# Patient Record
Sex: Female | Born: 1965 | Race: White | Hispanic: No | Marital: Single | Smoking: Former smoker
Health system: Southern US, Community
[De-identification: ages and names within clinical notes are randomized; demographics above are authoritative.]

## PROBLEM LIST (undated history)

## (undated) HISTORY — PX: CHOLECYSTECTOMY: SHX55

---

## 2005-02-23 ENCOUNTER — Inpatient Hospital Stay (HOSPITAL_COMMUNITY): Admission: RE | Admit: 2005-02-23 | Discharge: 2005-02-26 | Payer: Self-pay | Admitting: Gastroenterology

## 2005-02-25 ENCOUNTER — Encounter (INDEPENDENT_AMBULATORY_CARE_PROVIDER_SITE_OTHER): Payer: Self-pay | Admitting: Specialist

## 2007-03-18 ENCOUNTER — Ambulatory Visit: Payer: Self-pay | Admitting: Surgery

## 2008-11-09 ENCOUNTER — Other Ambulatory Visit: Admission: RE | Admit: 2008-11-09 | Discharge: 2008-11-09 | Payer: Self-pay | Admitting: Family Medicine

## 2010-10-11 NOTE — Procedures (Signed)
DUPLEX DEEP VENOUS EXAM - LOWER EXTREMITY   INDICATION:  Right leg edema   HISTORY:  Edema:  Two days of right leg edema  Trauma/Surgery:  Patient recently wore a brace on her right leg  Pain:  Two days, right leg  PE:  No  Previous DVT:  Possible DVT 20 years ago  Anticoagulants:  No  Other:  No   DUPLEX EXAM:                CFV   SFV   PopV  PTV    GSV                R  L  R  L  R  L  R   L  R  L  Thrombosis    o  o  o     o     o      o  Spontaneous   +  +  +     +     +      +  Phasic        +  +  +     +     +      +  Augmentation  +  +  +     +     +      +  Compressible  +  +  +     +     +      +  Competent     +  +  +     +     +      +   Legend:  + - yes  o - no  p - partial  D - decreased   IMPRESSION:  No evidence of right leg DVT, SVT, or Baker cyst.   _____________________________  Janetta Hora. Fields, MD   MC/MEDQ  D:  03/18/2007  T:  03/19/2007  Job:  [id number]

## 2010-10-14 NOTE — Op Note (Signed)
[name], [name]           ACCOUNT NO.:  [id number]   MEDICAL RECORD NO.:  [id number]          PATIENT TYPE:  INP   LOCATION:  1307                         FACILITY:  [facility]   PHYSICIAN:  Anselm Pancoast. Weatherly, M.D.DATE OF BIRTH:  06-05-1965   DATE OF PROCEDURE:  02/25/2005  DATE OF DISCHARGE:                                 OPERATIVE REPORT   PREOPERATIVE DIAGNOSIS:  Chronic cholecystitis with stones.   POSTOPERATIVE DIAGNOSIS:  Chronic cholecystitis with stones.   OPERATION:  Laparoscopic cholecystectomy with cholangiogram.   ANESTHESIA:  General anesthesia.   SURGEON:  Dr. Consuello Bossier.   ASSISTANT:  Angelia Mould. Derrell Lolling, M.D.   HISTORY:  [name] is a 45 year old female who was admitted two  days ago by Dr. Matthias Hughs, had an ERCP yesterday after she had three episodes  of severe epigastric pain and had a bump in her liver enzymes. She had seen  Dr. Luisa Hart for symptomatic gallstones and he had scheduled for a  laparoscopic cholecystectomy, I think in the first week in November but with  these symptoms she first went to, I think it was the [facility]. They  noted the abnormality in her laboratory studies and Dr. Matthias Hughs who was on  call was asked to see her. He admitted the patient and the following  morning, had an ERCP by Dr. Madilyn Fireman.  The ERCP appeared to be normal.  A  balloon was used to kind of flip through the ampullary and no sphincterotomy  performed and postoperatively, her pain was basically significantly  subsided. She had had a mildly elevated amylase of 509 and a lipase of 156  plus SGOT and SGPT of 3.80 and 390, respectively. Today we are planning to  proceed with a laparoscopic cholecystectomy and she is in agreement with the  planned procedure. The patient was identified, taken to the operative suite.  She has 3 grams of Unasyn and she has PAS stockings. The patient's abdomen  was prepped after induction of anesthesia and endotracheal tube,  oral tube  to the stomach and draped in a sterile manner. A small incision was made  below the umbilicus. The fascia identified. She is little deeper that she  grossly appears.  The fascia picked up between two Kocher's and a small  opening carefully made into the peritoneal cavity. Pursestring suture of 0  Vicryl was placed and the Hasson cannula was introduced.  The camera was  inserted. The gallbladder did have significant adhesions around it but not  acutely inflamed and not distended. The upper 10 mL trocar was placed in the  subxiphoid area and anesthetized in the fascia and the two lateral 5 mm  trocars were placed at the appropriate lateral position by Dr. Derrell Lolling after  anesthetizing the fascia. The gallbladder was retracted upward and with the  adhesions from the gallbladder to the omentum and also the duodenum were  kind of carefully dissected. These dropped down and then the proximal  portion of the gallbladder was identified and we opened the peritoneum here,  sort of separated the cystic duct from the cystic artery and then clipped  the gallbladder  cystic duct junction and also placed two clips proximally on  the cystic artery and one distally but did not divide any of these  structures.  I then opened the cystic duct carefully just proximal to the  clip because I could not really see where the junction of the cystic duct,  common hepatic and common bile duct was. The catheter was a Cook catheter  and it was held in place with a clip and then the C-arm brought in and a  repeat cholangiogram performed.  It did show prompt filling of the  extrahepatic biliary system flowing into the duodenum and the little cystic  duct was probably about 1.5 cm in length. We then removed the clip, holding  the catheter and the bile flush back and there was two little cholesterol  stones probably about 1.5 mm in size that we could flushing back but these  had not been noted on the cholangiogram.  The cystic duct was then carefully  freed up circumferentially with the right angle so I could get two good  clips on the area of the lobe where we had made the little otomy and then  placed a third clip distal to these and then divided the cystic duct. The  cystic artery was next divided. The clips had previously been placed and  then the gallbladder was freed up from its bed predominantly with the hook  electrocautery. We then switched the camera to the upper 10 mL trocar site.  There was good hemostasis and the gallbladder containing was grasped and  brought out through the fascial defect. The stones were small and  reinspection of the gallbladder fossa revealed good hemostasis. I put an  additional figure-of-eight suture of 0 Vicryl in the fascia at the  umbilicus, tied both and anesthetized the fascia at the umbilicus and put  one figure-of-eight suture in the anterior fascia in the subxiphoid trocar  site. The subcutaneous wounds were closed with Vicryl and Benzoin and Steri-  Strips were placed on the skin. The patient tolerated the procedure nicely.  We will probably elect to stay today and go home in the morning and  hopefully will have no further episodes of pain. I had discussed with the  patient's mother that with normal cholangiogram and was normal ERCP but  typically with these little stones that flushed back removal of the catheter  that there may be other little stones and she may have another little  episode of pain but hopefully these should pass and she should be ready to  be discharged in the a.m.           ______________________________  Anselm Pancoast. Zachery Dakins, M.D.     WJW/MEDQ  D:  02/25/2005  T:  02/25/2005  Job:  [id number]   cc:   Everardo All. Madilyn Fireman, M.D.  Fax: [phone]

## 2010-10-14 NOTE — Op Note (Signed)
[name], [name]           ACCOUNT NO.:  [id number]   MEDICAL RECORD NO.:  [id number]          PATIENT TYPE:  INP   LOCATION:  1307                         FACILITY:  [facility]   PHYSICIAN:  John C. Madilyn Fireman, M.D.    DATE OF BIRTH:  [birth year]   DATE OF PROCEDURE:  02/24/2005  DATE OF DISCHARGE:                                 OPERATIVE REPORT   Endoscopic retrograde cholangiopancreatography with sphincterotomy and  balloon sweep.   INDICATIONS FOR PROCEDURE:  Known gallstones with abdominal pain and  elevated liver function tests, suspicious for bile duct stones.   PROCEDURE:  The patient was placed in the prone position and placed on the  pulse monitor with continuous low-flow oxygen delivered by nasal cannula.  She was sedated with 60 mcg IV fentanyl and 6 mg IV Versed. Olympus video  side-viewing endoscope was advanced blindly into the oropharynx, esophagus,  and stomach. Stomach appeared grossly unremarkable. The pylorus was  traversed and papilla of Vater located on the medial duodenal wall had  normal appearance. Initial cannulation with a sphincterotome entered the  pancreatic duct and pancreatic injection performed revealing a normal  pancreatic duct. With repositioning, the common bile duct was selectively  cannulated with a wire. A cholangiogram was obtained which showed a slightly  dilated common bile duct with the cystic duct and gallbladder filling with  no intraluminal filling defects seen. Due to her pancreatitis and elevated  function tests, we went ahead with the sphincterotomy and used a 12-mm  balloon to sweep the duct three times with no stones seen to exit the  sphincterotomized ampule. The scope was then withdrawn and the patient  returned to the recovery room in stable condition. She tolerated the  procedure well. There were no immediate complications.   IMPRESSION:  Essentially normal cholangiogram with possible slight  dilatation of common bile duct  status post empiric sphincterotomy and  balloon sweep with no stones seen.   PLAN:  Will proceed with laparoscopic cholecystectomy.           ______________________________  Everardo All Madilyn Fireman, M.D.     JCH/MEDQ  D:  02/24/2005  T:  02/24/2005  Job:  [id number]   cc:   Luisa Hart, M.D.

## 2010-10-14 NOTE — Consult Note (Signed)
[name], [name]           ACCOUNT NO.:  [id number]   MEDICAL RECORD NO.:  [id number]          PATIENT TYPE:  INP   LOCATION:  1307                         [facility]ACILITY:  [facility]   PHYSICIAN:  Anselm Pancoast. Weatherly, M.D.DATE OF BIRTH:  1965/12/17   DATE OF CONSULTATION:  02/24/2005  DATE OF DISCHARGE:                                   CONSULTATION   CHIEF COMPLAINT:  Gallstones.   HISTORY:  [name] is a 45 year old female who has seen Dr. Luisa Hart  in the office approximately two weeks ago for symptomatic gallstones.  She  has had several episodes of pain since then and has called [facility],  where laboratory studies detected a definite bump in her liver function  studies, and she was admitted last evening for an ERCP and sphincterotomy  this morning.  She is now doing nicely, not having any significant abdominal  pain and desires to proceed on with surgery during this hospitalization  instead of waiting until the time that she is scheduled, which is  approximately five weeks from now.  The patient is not allergic to  penicillin and certainly appears to not have any problems with pancreatitis  following an ERCP.  I will see if arrangements can be made to have the OR  scheduled for Saturday morning.  We will place her n.p.o.  It looks like the  schedule does not permit surgery to be done tomorrow, and we will reschedule  her for Sunday.  Patient understands and is in agreement.  We will keep her  n.p.o. until we know that surgery cannot be done.  Hopefully, we will be  able to do the surgery about midday on Saturday.           ______________________________  Anselm Pancoast. Zachery Dakins, M.D.     WJW/MEDQ  D:  02/24/2005  T:  02/24/2005  Job:  [id number]

## 2010-10-14 NOTE — H&P (Signed)
[name], UHLIG NO.:  [id number]   MEDICAL RECORD NO.:  [id number]          PATIENT TYPE:  INP   LOCATION:  1307                         FACILITY:  [facility]   PHYSICIAN:  Bernette Redbird, M.D.   DATE OF BIRTH:  06-15-1965   DATE OF ADMISSION:  02/23/2005  DATE OF DISCHARGE:                                HISTORY & PHYSICAL   This 45 year old female is being admitted to the hospital because of  probable symptomatic biliary tract disease.   [name] has enjoyed excellent health in the past. She had a probable  episode of biliary pain back in February but otherwise her history has been  that, over the past six weeks, she has had increasingly frequent episodes of  pain, feeling well in between these episodes, with each episode lasting  somewhere between half an hour to 12 hours or so, typically a couple of  hours. This is associated with fatty food intolerance. The pain is felt as a  pressure in the epigastric area and a somewhat sharper pain in the right  infrascapular area and more recently today in the center of her back.   Recent evaluation included an abdominal ultrasound at [facility]  which showed multiple small gallstones and a 4 mm common bile duct, that was  obtained on January 20, 2005. Apparently around that time, the patient was  found to have an elevation of her all of her liver chemistries as well. In  any event, plans for elective cholecystectomy where made earlier this week  when she was seen at Dr. Clovis Pu. Cornett's office. In the meantime, her  symptoms progressed and she was reevaluated today at [facility]; at which time, significant elevation of liver chemistries was  noted with an AST of 649 and an ALT of 522. Based on this, I was called and  I recommended that the patient be admitted for preparation for ERCP in  anticipation of probable laparoscopic cholecystectomy. The patient was  agreeable to coming  in.   ALLERGIES:  No known drug allergies.   OUTPATIENT MEDICATIONS:  None.  Occasional OTC Advil.   OPERATIONS:  Removal of a lipoma from her arm. No other surgery.   CHRONIC MEDICAL ILLNESSES:  None.   HABITS:  Stopped smoking two years ago. Social ethanol.   FAMILY HISTORY:  Gallbladder disease in an aunt. Negative for colon cancer  but positive for colonic adenomas in her mother. Negative for liver disease.   SOCIAL HISTORY:  Single. Lives with her cat. No children. 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   REVIEW OF SYSTEMS:  The patient has achieved an intentional 45 pound weight  loss over the past seven months or so, having gained back a little bit of  that weight more recently. She tends to get heartburn when her weight goes  up substantially but otherwise does not experience it. GI review of systems  is otherwise globally negative from top to bottom.   PHYSICAL EXAMINATION:  GENERAL:  A pleasant, slightly overweight female in  no evident distress at all.  VITAL SIGNS:  Temperature 97.7, blood pressure 118/69, pulse 83,  respirations 22.  HEENT:  Oropharynx benign. No scleral icterus.  CHEST:  Clear to auscultation. No CVAT. No overt spine tenderness.  HEART:  Normal.  ABDOMEN:  Bowel sounds present. Mild epigastric tenderness without  significant guarding, certainly no mass-effect, rigidity or exquisite  tenderness. Right upper quadrant currently is essentially nontender and the  abdomen overall is soft and benign.   LABORATORY DATA:  On admission here, white count is 10,300 with 61% polys  and 30% lymphs. Hemoglobin 13.5, platelets 254,000. INR of 1.0. Liver  chemistries are elevated with total bilirubin of 1.2, alkaline phosphate  214, AST 381, ALT 399. Compared to liver chemistries obtained earlier today  (see above) this does represent a moderate improvement. Amylase and lipase  are elevated at 509 and 156 respectively.    Ultrasound (see above).   IMPRESSION:  1.  Gallstone pancreatitis, mild and/or resolving.  2.  Presumed choledocholithiasis.  3.  Cholelithiasis.  4.  History of obesity.   PLAN:  1.  Bowel rest, symptomatic management with IV pain medications, IV fluids,      and IV antiemetics as needed.  2.  ERCP when the patient is clinically stable, probably in the morning.  3.  Surgical consultation following ERCP for probable laparoscopic      cholecystectomy on this admission.   I have carefully discussed the nature, purpose, and risks of ERCP,  sphincterotomy, and stone extraction as well as certain alternatives with  the patient and her mother with the aid of a diagram. They seem to have a  good understanding and are agreeable to proceed.   We will recheck labs in the morning before making a final decision whether  to proceed with ERCP, the patient and her mother are aware that it will  probably be my partner, Dr. Dorena Cookey, performing the procedure.           ______________________________  Bernette Redbird, M.D.     RB/MEDQ  D:  02/23/2005  T:  02/24/2005  Job:  [id number]   cc:   Dellis Anes. Idell Pickles, M.D.  Fax: [phone]   Clovis Pu. Cornett, M.D.  [address] [place] [address]  [place] [address]

## 2010-10-14 NOTE — Discharge Summary (Signed)
[name], [name]           ACCOUNT NO.:  [id number]   MEDICAL RECORD NO.:  [id number]          PATIENT TYPE:  INP   LOCATION:  1307                         [facility]ACILITY:  [facility]   PHYSICIAN:  [name], Kaitlin GloverDATE OF BIRTH:  12-26-1965   DATE OF ADMISSION:  02/23/2005  DATE OF DISCHARGE:  02/26/2005                                 DISCHARGE SUMMARY   DISCHARGE DIAGNOSES:  1.  Chronic cholecystitis with passage of common duct stone.  2.  Mild pancreatitis.   OPERATION:  Endoscopic retrograde cholangiopancreatography and laparoscopic  cholecystectomy with cholangiogram.   HISTORY:  [name] is a 45 year old female who has seen Dr. Luisa Glover  for gallbladder disease and is scheduled for a cholecystectomy in November.  On the 28th, she went to Dr. Jobe Glover office with pain in the epigastric  area radiating to the small of the back.  Had laboratory studies which  showed abnormal liver function studies and then Dr. Matthias Glover was called and  arrangements for admission were made on February 23, 2005.  At the time of  her admission, she was described as having no evidence of distress, had  normal vital signs, and her abdomen had bowel sounds.  Mild tenderness  without significant guarding.  Essentially a nontender right upper quadrant.  Her white count was 9300.  SGOT 381.  SGPT 399.  Total bilirubin 1.2.  Amylase was 509.  She was admitted with the diagnosis of resolving gallstone  pancreatitis.  Dr. Matthias Glover arranged for her to have an ERCP the following  day, which was done by Dr. Madilyn Glover, and showed no evidence of any common duct  stones.  No sphincterotomy was performed.  I was called to see the patient  on Friday the 29th in Dr. Rosezena Glover and could we get her on for a  cholecystectomy the following day and arrangements were made to add her to  the OR schedule the next day, which was a Saturday.  She was taken to  surgery.  Dr. Derrell Glover assisted.  The gallbladder was  removed.  The  cholangiogram was unremarkable.  She did have small stones in some of the  fluid that came forth from the cystic duct, and clinically, I think she has  definitely passed a common duct stone.  She did nicely and was ready to be  discharged the following day.  Hopefully will have no further episodes of  pain.  I discussed with her that since really her ERCP had been negative and  the cholangiogram was normal but she did have these little, about 1 mm  cholesterol-type stones  within her cystic duct, we opened the cystic duct.  It is possible that  passing those may have contributed to her pain.  I will see her back in the  office for a follow-up appointment in approximately two weeks.  Hopefully,  she will have no further problems.  She has Vicodin for incisional pain.  Her incisions are healing nicely at the time of discharge.           ______________________________  Kaitlin Pancoast. Zachery Dakins, M.D.     WJW/MEDQ  D:  03/01/2005  T:  03/01/2005  Job:  [id number]   cc:   Bernette Redbird, M.D.  Fax: [phone]   Dellis Anes. Idell Pickles, M.D.  Fax: [phone]

## 2011-12-01 ENCOUNTER — Emergency Department (HOSPITAL_COMMUNITY): Payer: 59

## 2011-12-01 ENCOUNTER — Observation Stay (HOSPITAL_COMMUNITY)
Admission: EM | Admit: 2011-12-01 | Discharge: 2011-12-04 | Disposition: A | Discharge status: Home / Self Care | Payer: 59 | Attending: Urology | Admitting: Urology

## 2011-12-01 ENCOUNTER — Encounter (HOSPITAL_COMMUNITY): Payer: Self-pay | Admitting: Anesthesiology

## 2011-12-01 ENCOUNTER — Observation Stay (HOSPITAL_COMMUNITY): Payer: 59 | Admitting: Anesthesiology

## 2011-12-01 ENCOUNTER — Encounter (HOSPITAL_COMMUNITY)
Admission: EM | Disposition: A | Discharge status: Home / Self Care | Payer: Self-pay | Source: Home / Self Care | Attending: Emergency Medicine

## 2011-12-01 ENCOUNTER — Other Ambulatory Visit: Payer: Self-pay | Admitting: Urology

## 2011-12-01 ENCOUNTER — Encounter (HOSPITAL_COMMUNITY): Payer: Self-pay | Admitting: Emergency Medicine

## 2011-12-01 ENCOUNTER — Inpatient Hospital Stay: Admit: 2011-12-01 | Payer: Self-pay | Admitting: Urology

## 2011-12-01 DIAGNOSIS — D72829 Elevated white blood cell count, unspecified: Secondary | ICD-10-CM | POA: Insufficient documentation

## 2011-12-01 DIAGNOSIS — N201 Calculus of ureter: Principal | ICD-10-CM | POA: Insufficient documentation

## 2011-12-01 DIAGNOSIS — Z9089 Acquired absence of other organs: Secondary | ICD-10-CM | POA: Insufficient documentation

## 2011-12-01 DIAGNOSIS — N12 Tubulo-interstitial nephritis, not specified as acute or chronic: Secondary | ICD-10-CM

## 2011-12-01 LAB — PREGNANCY, URINE: Preg Test, Ur: NEGATIVE

## 2011-12-01 LAB — BASIC METABOLIC PANEL
CO2: 18 mEq/L — ABNORMAL LOW (ref 19–32)
Calcium: 9.7 mg/dL (ref 8.4–10.5)
Chloride: 99 mEq/L (ref 96–112)
GFR calc non Af Amer: 78 mL/min — ABNORMAL LOW (ref >90)
Potassium: 4.8 mEq/L (ref 3.5–5.1)
Sodium: 134 mEq/L — ABNORMAL LOW (ref 135–145)

## 2011-12-01 LAB — URINALYSIS, ROUTINE W REFLEX MICROSCOPIC
Bilirubin Urine: NEGATIVE
Glucose, UA: NEGATIVE mg/dL

## 2011-12-01 LAB — URINE MICROSCOPIC-ADD ON

## 2011-12-01 SURGERY — CYSTOSCOPY, WITH STENT INSERTION
Anesthesia: General | Site: Ureter | Laterality: Right | Wound class: Clean Contaminated

## 2011-12-01 MED ORDER — KCL IN DEXTROSE-NACL 20-5-0.45 MEQ/L-%-% IV SOLN
INTRAVENOUS | Status: DC
  Filled 2011-12-01: qty 1000

## 2011-12-01 MED ORDER — OXYCODONE-ACETAMINOPHEN 5-325 MG PO TABS
1.0000 | ORAL_TABLET | ORAL | Status: DC | PRN
  Administered 2011-12-02 (×2): 1 via ORAL
  Administered 2011-12-02 – 2011-12-03 (×3): 2 via ORAL
  Administered 2011-12-03 (×2): 1 via ORAL
  Filled 2011-12-01: qty 1
  Filled 2011-12-01: qty 2
  Filled 2011-12-01: qty 1
  Filled 2011-12-01 (×2): qty 2
  Filled 2011-12-01: qty 1
  Filled 2011-12-01: qty 2

## 2011-12-01 MED ORDER — FENTANYL CITRATE 0.05 MG/ML IJ SOLN
INTRAMUSCULAR | Status: DC | PRN
  Administered 2011-12-01 (×2): 50 ug via INTRAVENOUS

## 2011-12-01 MED ORDER — LACTATED RINGERS IV SOLN
INTRAVENOUS | Status: DC | PRN
  Administered 2011-12-01: 17:00:00 via INTRAVENOUS

## 2011-12-01 MED ORDER — ACETAMINOPHEN 325 MG PO TABS
650.0000 mg | ORAL_TABLET | ORAL | Status: DC | PRN
  Administered 2011-12-02: 650 mg via ORAL
  Administered 2011-12-03 (×2): 325 mg via ORAL
  Administered 2011-12-04: 650 mg via ORAL
  Filled 2011-12-01: qty 1
  Filled 2011-12-01: qty 2
  Filled 2011-12-01: qty 1
  Filled 2011-12-01: qty 2

## 2011-12-01 MED ORDER — LIDOCAINE HCL 1 % IJ SOLN
INTRAMUSCULAR | Status: DC | PRN
  Administered 2011-12-01: 50 mg via INTRADERMAL

## 2011-12-01 MED ORDER — PHENAZOPYRIDINE HCL 200 MG PO TABS
200.0000 mg | ORAL_TABLET | Freq: Three times a day (TID) | ORAL | Status: DC | PRN
  Administered 2011-12-02: 200 mg via ORAL
  Filled 2011-12-01: qty 1

## 2011-12-01 MED ORDER — FENTANYL CITRATE 0.05 MG/ML IJ SOLN: 25.0000 ug | INTRAMUSCULAR | Status: DC | PRN

## 2011-12-01 MED ORDER — GENTAMICIN SULFATE 40 MG/ML IJ SOLN
480.0000 mg | INTRAVENOUS | Status: DC
  Administered 2011-12-01 – 2011-12-03 (×3): 480 mg via INTRAVENOUS
  Filled 2011-12-01 (×3): qty 12

## 2011-12-01 MED ORDER — IOHEXOL 300 MG/ML  SOLN
100.0000 mL | Freq: Once | INTRAMUSCULAR | Status: AC | PRN
  Administered 2011-12-01: 100 mL via INTRAVENOUS

## 2011-12-01 MED ORDER — SODIUM CHLORIDE 0.9 % IV SOLN
INTRAVENOUS | Status: DC
  Administered 2011-12-01: 11:00:00 via INTRAVENOUS

## 2011-12-01 MED ORDER — BISACODYL 10 MG RE SUPP: 10.0000 mg | Freq: Every day | RECTAL | Status: DC | PRN

## 2011-12-01 MED ORDER — HYOSCYAMINE SULFATE 0.125 MG SL SUBL
0.1250 mg | SUBLINGUAL_TABLET | SUBLINGUAL | Status: DC | PRN
  Filled 2011-12-01: qty 1

## 2011-12-01 MED ORDER — ONDANSETRON HCL 4 MG/2ML IJ SOLN
4.0000 mg | INTRAMUSCULAR | Status: DC | PRN
  Administered 2011-12-03: 4 mg via INTRAVENOUS
  Filled 2011-12-01: qty 2

## 2011-12-01 MED ORDER — IOHEXOL 300 MG/ML  SOLN
INTRAMUSCULAR | Status: DC | PRN
  Administered 2011-12-01: 9 mL

## 2011-12-01 MED ORDER — ACETAMINOPHEN 325 MG PO TABS
ORAL_TABLET | ORAL | Status: AC
  Administered 2011-12-03: 325 mg via ORAL
  Filled 2011-12-01: qty 2

## 2011-12-01 MED ORDER — HYDROMORPHONE HCL PF 1 MG/ML IJ SOLN: 0.5000 mg | INTRAMUSCULAR | Status: DC | PRN

## 2011-12-01 MED ORDER — BELLADONNA ALKALOIDS-OPIUM 16.2-60 MG RE SUPP
RECTAL | Status: DC | PRN
  Administered 2011-12-01: 1 via RECTAL

## 2011-12-01 MED ORDER — PROPOFOL 10 MG/ML IV EMUL
INTRAVENOUS | Status: DC | PRN
  Administered 2011-12-01: 20 mg via INTRAVENOUS
  Administered 2011-12-01: 180 mg via INTRAVENOUS

## 2011-12-01 MED ORDER — ZOLPIDEM TARTRATE 5 MG PO TABS: 5.0000 mg | ORAL_TABLET | Freq: Every evening | ORAL | Status: DC | PRN

## 2011-12-01 MED ORDER — DEXTROSE 5 % IV SOLN
1.0000 g | INTRAVENOUS | Status: DC
  Administered 2011-12-02 – 2011-12-03 (×2): 1 g via INTRAVENOUS
  Filled 2011-12-01 (×3): qty 10

## 2011-12-01 MED ORDER — KETOROLAC TROMETHAMINE 30 MG/ML IJ SOLN: 15.0000 mg | Freq: Once | INTRAMUSCULAR | Status: DC | PRN

## 2011-12-01 MED ORDER — ONDANSETRON HCL 4 MG/2ML IJ SOLN: 4.0000 mg | INTRAMUSCULAR | Status: DC | PRN

## 2011-12-01 MED ORDER — BELLADONNA ALKALOIDS-OPIUM 16.2-60 MG RE SUPP
RECTAL | Status: AC
  Filled 2011-12-01: qty 1

## 2011-12-01 MED ORDER — PROMETHAZINE HCL 25 MG/ML IJ SOLN: 6.2500 mg | INTRAMUSCULAR | Status: DC | PRN

## 2011-12-01 MED ORDER — IOHEXOL 300 MG/ML  SOLN
INTRAMUSCULAR | Status: AC
  Filled 2011-12-01: qty 1

## 2011-12-01 MED ORDER — ACETAMINOPHEN 325 MG PO TABS
650.0000 mg | ORAL_TABLET | ORAL | Status: DC | PRN
  Administered 2011-12-01: 650 mg via ORAL

## 2011-12-01 MED ORDER — MIDAZOLAM HCL 5 MG/5ML IJ SOLN
INTRAMUSCULAR | Status: DC | PRN
  Administered 2011-12-01: 2 mg via INTRAVENOUS

## 2011-12-01 MED ORDER — DEXTROSE 5 % IV SOLN
1.0000 g | Freq: Once | INTRAVENOUS | Status: AC
  Administered 2011-12-01: 1 g via INTRAVENOUS
  Filled 2011-12-01: qty 10

## 2011-12-01 MED ORDER — OXYCODONE-ACETAMINOPHEN 5-325 MG PO TABS: 1.0000 | ORAL_TABLET | ORAL | Status: DC | PRN

## 2011-12-01 MED ORDER — HYDROMORPHONE HCL PF 1 MG/ML IJ SOLN
1.0000 mg | Freq: Once | INTRAMUSCULAR | Status: AC
  Administered 2011-12-01: 1 mg via INTRAVENOUS
  Filled 2011-12-01: qty 1

## 2011-12-01 MED ORDER — DEXTROSE 5 % IV SOLN: 1.0000 g | INTRAVENOUS | Status: DC

## 2011-12-01 MED ORDER — HYDROMORPHONE HCL PF 1 MG/ML IJ SOLN
0.5000 mg | INTRAMUSCULAR | Status: DC | PRN
  Administered 2011-12-01: 1 mg via INTRAVENOUS
  Filled 2011-12-01: qty 1

## 2011-12-01 MED ORDER — LACTATED RINGERS IV SOLN
INTRAVENOUS | Status: DC
  Administered 2011-12-01: 19:00:00 via INTRAVENOUS

## 2011-12-01 MED ORDER — STERILE WATER FOR IRRIGATION IR SOLN
Status: DC | PRN
  Administered 2011-12-01: 3000 mL via INTRAVESICAL

## 2011-12-01 MED ORDER — CALCIUM CARBONATE ANTACID 500 MG PO CHEW
1.0000 | CHEWABLE_TABLET | Freq: Two times a day (BID) | ORAL | Status: DC
  Filled 2011-12-01 (×7): qty 1

## 2011-12-01 MED ORDER — KCL IN DEXTROSE-NACL 20-5-0.45 MEQ/L-%-% IV SOLN
INTRAVENOUS | Status: DC
  Administered 2011-12-01 – 2011-12-02 (×2): via INTRAVENOUS
  Filled 2011-12-01 (×7): qty 1000

## 2011-12-01 MED ORDER — ONDANSETRON HCL 4 MG/2ML IJ SOLN
4.0000 mg | Freq: Once | INTRAMUSCULAR | Status: AC
  Administered 2011-12-01: 4 mg via INTRAVENOUS
  Filled 2011-12-01: qty 2

## 2011-12-01 SURGICAL SUPPLY — 11 items
BAG URO CATCHER STRL LF (DRAPE) ×2 IMPLANT
CATH URET 5FR 28IN OPEN ENDED (CATHETERS) ×2 IMPLANT
CLOTH BEACON ORANGE TIMEOUT ST (SAFETY) ×2 IMPLANT
DRAPE CAMERA CLOSED 9X96 (DRAPES) ×2 IMPLANT
GLOVE SURG SS PI 8.0 STRL IVOR (GLOVE) ×2 IMPLANT
GOWN PREVENTION PLUS XLARGE (GOWN DISPOSABLE) ×2 IMPLANT
GOWN STRL REIN XL XLG (GOWN DISPOSABLE) ×2 IMPLANT
MANIFOLD NEPTUNE II (INSTRUMENTS) ×2 IMPLANT
PACK CYSTO (CUSTOM PROCEDURE TRAY) ×2 IMPLANT
STENT CONTOUR 6FRX26X.038 (STENTS) ×2 IMPLANT
TUBING CONNECTING 10 (TUBING) ×2 IMPLANT

## 2011-12-01 NOTE — Transfer of Care (Signed)
Immediate Anesthesia Transfer of Care Note  Patient: [name]  Procedure(s) Performed: Procedure(s) (LRB): CYSTOSCOPY WITH STENT PLACEMENT (Right)  Patient Location: PACU  Anesthesia Type: General  Level of Consciousness: awake, oriented and patient cooperative  Airway & Oxygen Therapy: Patient Spontanous Breathing and Patient connected to face mask oxygen  Post-op Assessment: Report given to PACU RN, Post -op Vital signs reviewed and stable and Patient moving all extremities  Post vital signs: stable  Complications: No apparent anesthesia complications

## 2011-12-01 NOTE — Progress Notes (Signed)
ANTIBIOTIC CONSULT NOTE - INITIAL  Pharmacy Consult for Gentamicin Indication: Treatment levels for UTI/ureteral obstruction w/ stone  Allergies  Allergen Reactions  . Ciprofloxacin Itching    Patient Measurements: Height: 5\' 5"  (165.1 cm) Weight: 182 lb 5.1 oz (82.7 kg) IBW/kg (Calculated) : 57  Adjusted Body Weight: 68.2kg  Vital Signs: Temp: 98.3 F (36.8 C) (07/05 1935) Temp src: Oral (07/05 1536) BP: 115/65 mmHg (07/05 1935) Pulse Rate: 123  (07/05 1935) Intake/Output from previous day:   Intake/Output from this shift: Total I/O In: 440 [P.O.:240; I.V.:200] Out: 650 [Urine:650]  Labs:  Basename 12/01/11 1050  WBC --  HGB --  PLT --  LABCREA --  CREATININE 0.89   Estimated Creatinine Clearance: 85.7 ml/min (by C-G formula based on Cr of 0.89). No results found for this basename: VANCOTROUGH:2,VANCOPEAK:2,VANCORANDOM:2,GENTTROUGH:2,GENTPEAK:2,GENTRANDOM:2,TOBRATROUGH:2,TOBRAPEAK:2,TOBRARND:2,AMIKACINPEAK:2,AMIKACINTROU:2,AMIKACIN:2, in the last 72 hours   Microbiology: No results found for this or any previous visit (from the past 720 hour(s)).  Medical History: History reviewed. No pertinent past medical history.  Medications:  Scheduled:    . acetaminophen      . calcium carbonate  1 tablet Oral BID  . cefTRIAXone (ROCEPHIN)  IV  1 g Intravenous Once  . cefTRIAXone (ROCEPHIN)  IV  1 g Intravenous Q24H   .  HYDROmorphone (DILAUDID) injection  1 mg Intravenous Once  .  HYDROmorphone (DILAUDID) injection  1 mg Intravenous Once  . ondansetron  4 mg Intravenous Once  . DISCONTD: cefTRIAXone (ROCEPHIN)  IV  1 g Intravenous Q24H   Infusions:    . dextrose 5 % and 0.45 % NaCl with KCl 20 mEq/L    . DISCONTD: sodium chloride 125 mL/hr at 12/01/11 1100  . DISCONTD: dextrose 5 % and 0.45 % NaCl with KCl 20 mEq/L    . DISCONTD: lactated ringers 125 mL/hr at 12/01/11 1832   PRN: acetaminophen, bisacodyl, HYDROmorphone (DILAUDID) injection, hyoscyamine,  iohexol, ondansetron, oxyCODONE-acetaminophen, phenazopyridine, zolpidem, DISCONTD: acetaminophen, DISCONTD: bisacodyl, DISCONTD: fentaNYL, DISCONTD:  HYDROmorphone (DILAUDID) injection, DISCONTD: hyoscyamine, DISCONTD: iohexol, DISCONTD: ketorolac, DISCONTD: ondansetron, DISCONTD: opium-belladonna, DISCONTD: oxyCODONE-acetaminophen, DISCONTD: promethazine DISCONTD: sterile water for irrigation, DISCONTD: zolpidem Assessment: 46 yo Female with pyelonephritis (ureteral obstuction with stone along with UTI)  Goal of Therapy:  Peak levels for Gent (UTI, pyelonephritis) = 4-6 Gentamicin trough < 1  Plan:   Begin Gentamicin 480mg   q 24 hours (7mg /kg q 24 hr)  Measure antibiotic drug levels at steady state  Monitor CrCl  Loletta Specter 12/01/2011,8:15 PM

## 2011-12-01 NOTE — ED Notes (Signed)
Pt c/o RLQ abd pain and nausea and diarrhea since midnight.  Saw PCP this am who drew blood and found WBC to be elevated, pt sent for possible appendicitis.

## 2011-12-01 NOTE — Anesthesia Preprocedure Evaluation (Addendum)
Anesthesia Evaluation  Patient identified by MRN, date of birth, ID band Patient awake    Reviewed: Allergy & Precautions, H&P , NPO status , Patient's Chart, lab work & pertinent test results  Airway Mallampati: II TM Distance: <3 FB Neck ROM: Full    Dental No notable dental hx.    Pulmonary neg pulmonary ROS,  breath sounds clear to auscultation  + decreased breath sounds      Cardiovascular negative cardio ROS  Rhythm:Regular Rate:Tachycardia     Neuro/Psych negative neurological ROS  negative psych ROS   GI/Hepatic negative GI ROS, Neg liver ROS,   Endo/Other  negative endocrine ROS  Renal/GU negative Renal ROS  negative genitourinary   Musculoskeletal negative musculoskeletal ROS (+)   Abdominal   Peds negative pediatric ROS (+)  Hematology negative hematology ROS (+)   Anesthesia Other Findings   Reproductive/Obstetrics negative OB ROS                          Anesthesia Physical Anesthesia Plan  ASA: I and Emergent  Anesthesia Plan: General   Post-op Pain Management:    Induction: Intravenous  Airway Management Planned: LMA and Oral ETT  Additional Equipment:   Intra-op Plan:   Post-operative Plan: Extubation in OR  Informed Consent: I have reviewed the patients History and Physical, chart, labs and discussed the procedure including the risks, benefits and alternatives for the proposed anesthesia with the patient or authorized representative who has indicated his/her understanding and acceptance.   Dental advisory given  Plan Discussed with: CRNA and Surgeon  Anesthesia Plan Comments:         Anesthesia Quick Evaluation

## 2011-12-01 NOTE — ED Notes (Signed)
Pt c/o RLQ abd pain and vomiting 1230a.  States this is the first time she has had this problem.  Had minimal diarrhea around 5a.

## 2011-12-01 NOTE — ED Notes (Signed)
Attempted to notify CT pt finished contrast.

## 2011-12-01 NOTE — Anesthesia Procedure Notes (Signed)
Procedure Name: LMA Insertion Date/Time: 12/01/2011 5:48 PM Performed by: Trellis Paganini Pre-anesthesia Checklist: Patient identified Patient Re-evaluated:Patient Re-evaluated prior to inductionOxygen Delivery Method: Circle system utilized Preoxygenation: Pre-oxygenation with 100% oxygen Intubation Type: IV induction LMA: LMA with gastric port inserted LMA Size: 4.0 Placement Confirmation: positive ETCO2 and breath sounds checked- equal and bilateral

## 2011-12-01 NOTE — H&P (Signed)
Urology Admission H&P  Chief Complaint: right flank pain  History of Present Illness: [name] is a 45 yo WF who had  Some pain under the right shoulder blade Saturday and then 2 am today she had the onset of severe right flank pain with radiation to the hip.  She was having N/V and diarrhea and has had a low grade fever.  She had some voiding symptoms with a little pinprick sensation in the urethral area.   She saw [name] who found an infected urine and a WBC of 20.8.  She was sent for imaging and was found to have an 11mm RUPJ stone with obstruction.   She continues to have pain.  She has had prior UTI's but no stones or GU surgery.  She does have a family history of stones.  History reviewed. No pertinent past medical history. Past Surgical History  Procedure Date  . Cholecystectomy     Home Medications:  Prescriptions prior to admission  Medication Sig Dispense Refill  . calcium carbonate (TUMS - DOSED IN MG ELEMENTAL CALCIUM) 500 MG chewable tablet Chew 1 tablet by mouth 2 (two) times daily.       Allergies:  Allergies  Allergen Reactions  . Ciprofloxacin Itching    History reviewed. No pertinent family history. Social History:  reports that she has never smoked. She does not have any smokeless tobacco history on file. She reports that she drinks alcohol. Her drug history not on file.  Review of Systems  Constitutional: Positive for fever.  Cardiovascular: Positive for leg swelling.  Gastrointestinal: Positive for nausea, vomiting, abdominal pain and diarrhea.  All other systems reviewed and are negative.    Physical Exam:  Vital signs in last 24 hours: Temp:  [98 F (36.7 C)-98.7 F (37.1 C)] 98.5 F (36.9 C) (07/05 1536) Pulse Rate:  [99-120] 120  (07/05 1536) Resp:  [16-20] 16  (07/05 1536) BP: (121-156)/(78-87) 121/78 mmHg (07/05 1536) SpO2:  [94 %-99 %] 97 % (07/05 1536) Weight:  [82.7 kg (182 lb 5.1 oz)] 82.7 kg (182 lb 5.1 oz) (07/05 1536) Physical Exam    Constitutional: She is oriented to person, place, and time. She appears well-developed and well-nourished. She appears distressed.  HENT:  Head: Normocephalic and atraumatic.  Neck: Normal range of motion. Neck supple. No thyromegaly present.  Cardiovascular: Normal rate.        With a mild tachycardia.  Respiratory: Effort normal and breath sounds normal.  GI: Soft. There is tenderness (right upper and lower quadrants.). There is rebound.  Musculoskeletal: Normal range of motion.  Neurological: She is alert and oriented to person, place, and time.  Skin: Skin is warm and dry.  Psychiatric: She has a normal mood and affect.    Laboratory Data:  Results for orders placed during the hospital encounter of 12/01/11 (from the past 24 hour(s))  BASIC METABOLIC PANEL     Status: Abnormal   Collection Time   12/01/11 10:50 AM      Component Value Range   Sodium 134 (*) 135 - 145 mEq/L   Potassium 4.8  3.5 - 5.1 mEq/L   Chloride 99  96 - 112 mEq/L   CO2 18 (*) 19 - 32 mEq/L   Glucose, Bld 152 (*) 70 - 99 mg/dL   BUN 16  6 - 23 mg/dL   Creatinine, Ser 0.89  0.50 - 1.10 mg/dL   Calcium 9.7  8.4 - 10.5 mg/dL   GFR calc non Af Amer 78 (*) >  90 mL/min   GFR calc Af Amer >90  >90 mL/min  URINALYSIS, ROUTINE W REFLEX MICROSCOPIC     Status: Abnormal   Collection Time   12/01/11  1:09 PM      Component Value Range   Color, Urine YELLOW  YELLOW   APPearance CLOUDY (*) CLEAR   Specific Gravity, Urine 1.021  1.005 - 1.030   pH 7.5  5.0 - 8.0   Glucose, UA NEGATIVE  NEGATIVE mg/dL   Hgb urine dipstick MODERATE (*) NEGATIVE   Bilirubin Urine NEGATIVE  NEGATIVE   Ketones, ur NEGATIVE  NEGATIVE mg/dL   Protein, ur 100 (*) NEGATIVE mg/dL   Urobilinogen, UA 0.2  0.0 - 1.0 mg/dL   Nitrite NEGATIVE  NEGATIVE   Leukocytes, UA LARGE (*) NEGATIVE  PREGNANCY, URINE     Status: Normal   Collection Time   12/01/11  1:09 PM      Component Value Range   Preg Test, Ur NEGATIVE  NEGATIVE  URINE  MICROSCOPIC-ADD ON     Status: Abnormal   Collection Time   12/01/11  1:09 PM      Component Value Range   Squamous Epithelial / LPF RARE  RARE   WBC, UA TOO NUMEROUS TO COUNT  <3 WBC/hpf   RBC / HPF 7-10  <3 RBC/hpf   Bacteria, UA MANY (*) RARE   No results found for this or any previous visit (from the past 240 hour(s)). Creatinine:  Basename 12/01/11 1050  CREATININE 0.89   I have reviewed her films and labs from Dr. McNeil.  Impression/Assessment:  11mm RUPJ stone with UTI and fever.  Plan:  I am going to take her for cystoscopy with right ureteral stenting.  The risks of bleeding, infection and ureteral injury reviewed as well as the risk of thrombotic events and anesthetic complications.  [name] J 12/01/2011, 4:55 PM       

## 2011-12-01 NOTE — Brief Op Note (Signed)
12/01/2011  6:08 PM  PATIENT:  [name]  46 y.o. female  PRE-OPERATIVE DIAGNOSIS:  ureteral obstruction, right from stone with UTI  POST-OPERATIVE DIAGNOSIS:  same  PROCEDURE:  Procedure(s) (LRB): CYSTOSCOPY, RIGHT RETROGRADE PYELOGRAM WITH STENT PLACEMENT (Right)  SURGEON:  Surgeon(s) and Role:    * Anner Crete, MD - Primary  PHYSICIAN ASSISTANT:   ASSISTANTS: none   ANESTHESIA:   general  EBL:  Total I/O In: -  Out: 50 [Urine:50]  BLOOD ADMINISTERED:none  DRAINS: 6x26 right JJ stent   LOCAL MEDICATIONS USED:  NONE  SPECIMEN:  No Specimen  DISPOSITION OF SPECIMEN:  N/A  COUNTS:  YES  TOURNIQUET:  * No tourniquets in log *  DICTATION: .Other Dictation: Dictation Number [id number]  PLAN OF CARE: Admit for overnight observation  PATIENT DISPOSITION:  PACU - hemodynamically stable.   Delay start of Pharmacological VTE agent (>24hrs) due to surgical blood loss or risk of bleeding: no

## 2011-12-01 NOTE — ED Provider Notes (Addendum)
History     CSN: [id number]  Arrival date & time 12/01/11  1031   First MD Initiated Contact with Patient 12/01/11 1041      Chief Complaint  Patient presents with  . Abdominal Pain    (Consider location/radiation/quality/duration/timing/severity/associated sxs/prior treatment) Patient is a 46 y.o. female presenting with abdominal pain. The history is provided by the patient and a parent.  Abdominal Pain The primary symptoms of the illness include abdominal pain, nausea, vomiting and diarrhea. The primary symptoms of the illness do not include fever, dysuria or vaginal discharge.  Symptoms associated with the illness do not include chills, constipation, hematuria or back pain.   the patient is a 46 year old, female, with a history of cholecystectomy, who presents emergency department complaining of right-sided abdominal pain, with vomiting, and diarrhea.  The pain began around 2:30 this morning and has progressively gotten worse.  She says that she has a baseline low, temperature, and her temperature is slightly elevated.  Now.  She denies chills.  She denies urinary tract symptoms.  She was seen by her primary care physician, who did a CBC, and urinalysis, and then, she was sent over here for evaluation of possible appendicitis.  History reviewed. No pertinent past medical history.  Past Surgical History  Procedure Date  . Cholecystectomy     History reviewed. No pertinent family history.  History  Substance Use Topics  . Smoking status: Never Smoker   . Smokeless tobacco: Not on file  . Alcohol Use: Yes     occasionally    OB History    Grav Para Term Preterm Abortions TAB SAB Ect Mult Living                  Review of Systems  Constitutional: Negative for fever and chills.  Gastrointestinal: Positive for nausea, vomiting, abdominal pain and diarrhea. Negative for constipation and blood in stool.  Genitourinary: Negative for dysuria, hematuria and vaginal discharge.    Musculoskeletal: Negative for back pain.  Neurological: Negative for headaches.  Psychiatric/Behavioral: Negative for confusion.  All other systems reviewed and are negative.    Allergies  Ciprofloxacin  Home Medications   Current Outpatient Rx  Name Route Sig Dispense Refill  . CALCIUM CARBONATE ANTACID 500 MG PO CHEW Oral Chew 1 tablet by mouth 2 (two) times daily.      BP 156/83  Pulse 102  Temp 98 F (36.7 C)  Resp 20  SpO2 98%  LMP 11/17/2011  Physical Exam  Nursing note and vitals reviewed. Constitutional: She is oriented to person, place, and time. She appears well-developed and well-nourished. No distress.       Tearful holding right lower abdomen  HENT:  Head: Normocephalic and atraumatic.  Eyes: Conjunctivae and EOM are normal.  Neck: Normal range of motion. Neck supple.  Cardiovascular: Regular rhythm.   No murmur heard.      Tachycardia  Pulmonary/Chest: Effort normal and breath sounds normal. She has no rales.  Abdominal: Soft. She exhibits no distension. There is tenderness. There is guarding. There is no rebound.       Right lower abdominal tenderness, with guarding Positive Rovsing sign  Musculoskeletal: Normal range of motion. She exhibits no edema.  Neurological: She is alert and oriented to person, place, and time.  Skin: Skin is warm and dry.  Psychiatric: She has a normal mood and affect. Thought content normal.    ED Course  Procedures (including critical care time) 46 year old, female, with a history of  cholecystectomy, presents with progressive right lower abdominal pain.  She has right lower abdominal pain, and tenderness, with guarding.  She has a leukocytosis.  I suspect, that she's got an appendicitis.  Therefore, we will give her analgesics, and antiemetics, and perform a CAT scan of her abdomen.  For evaluation.   Labs Reviewed  BASIC METABOLIC PANEL   No results found.   No diagnosis found.  1:17 PM Pt says pain is  returning  2:20 PM Spoke with Dr. Annabell Howells. He will admit and add on for stent this evening.  MDM  Abdominal pain with leukocytosis Ureteral stone with pyelonephritis        Cheri Guppy, MD 12/01/11 1058  Cheri Guppy, MD 12/01/11 1421

## 2011-12-02 LAB — BASIC METABOLIC PANEL
CO2: 23 mEq/L (ref 19–32)
Chloride: 100 mEq/L (ref 96–112)
Creatinine, Ser: 1.09 mg/dL (ref 0.50–1.10)
GFR calc Af Amer: 70 mL/min — ABNORMAL LOW (ref >90)
Potassium: 4.2 mEq/L (ref 3.5–5.1)
Sodium: 134 mEq/L — ABNORMAL LOW (ref 135–145)

## 2011-12-02 LAB — GENTAMICIN LEVEL, RANDOM: Gentamicin Rm: 3.1 ug/mL

## 2011-12-02 LAB — CBC
Platelets: 242 10*3/uL (ref 150–400)
RBC: 4.62 MIL/uL (ref 3.87–5.11)
RDW: 14.7 % (ref 11.5–15.5)
WBC: 31.3 10*3/uL — ABNORMAL HIGH (ref 4.0–10.5)

## 2011-12-02 MED ORDER — SODIUM CHLORIDE 0.9 % IV SOLN
INTRAVENOUS | Status: DC
  Administered 2011-12-02 (×2): via INTRAVENOUS

## 2011-12-02 NOTE — Progress Notes (Signed)
Patient stated this afternoon that she feels "better than I have all day" and that she was sweating earlier and thinks her fever broke. Her VS were taken and is afebrile at 98.6 oral but her HR is tachy at 120. BP 116/78. Will continue to monitor patient.

## 2011-12-02 NOTE — Progress Notes (Signed)
Dr. Brunilda Payor notified pt's temp 102.8 oral, pt received to percocet and rocephin and gentamycin already ordered.  RN continuing to monitor.

## 2011-12-02 NOTE — Op Note (Signed)
NAMEMANDI, MATTIOLI NO.:  [id number]  MEDICAL RECORD NO.:  [id number]  LOCATION:  [facility]                         FACILITY:  [facility]  PHYSICIAN:  Excell Seltzer. Annabell Howells, M.D.    DATE OF BIRTH:  24-Oct-1965  DATE OF PROCEDURE:  12/01/2011 DATE OF DISCHARGE:                              OPERATIVE REPORT   PROCEDURES:  Cystoscopy, right retrograde pyelogram with placement of a right double-J stent.  PREOPERATIVE DIAGNOSIS:  Right ureteropelvic junction stone with infection.  POSTOPERATIVE DIAGNOSIS:  Right ureteropelvic junction stone with infection.  SURGEON:  Excell Seltzer. Annabell Howells, M.D.  ANESTHESIA:  General.  SPECIMEN:  None.  DRAINS:  Six-French 26-cm double-J stent on the right.  COMPLICATIONS:  None.  INDICATIONS:  Cayla is a 46 year old white female, who had the sudden onset this morning of right flank pain with low-grade fever.  She was seen by Dr. Selena Batten and found evidence of UTI and a white count of 20.8, she underwent imaging and was found to have an 11-mm obstructing left UPJ stone, and she was admitted for cystoscopy and stenting.  FINDINGS FOR PROCEDURE:  She was given Rocephin earlier today.  She was taken to the operating room, where general anesthetic was induced.  She was placed in lithotomy position.  Her perineum and genitalia were prepped with Betadine solution.  She was draped in usual sterile fashion.  Cystoscopy was performed using a 22-French scope and 12-degree lens.  Examination revealed a normal urethra.  The urine was very turbid and the bladder was irrigated to visualize bladder wall which was smooth with some inflammatory changes but no tumors or stones were noted, ureteral orifices were unremarkable.  The right ureteral orifice was cannulated with a 5-French open-end catheter and contrast was instilled.  This revealed a normal ureter to the level of L2 where a filling defect consistent with the stone was identified.  There was  some dilation proximally, although I did not instilled the contrast under pressure.  The stone appeared to be approximately 7 x 11 mm.  Once retrograde pyelogram had been performed, a guidewire was passed to the kidney by the stone, and this was followed by 6-French 26-cm double- J stent.  The wire was removed leaving good coil in the kidney and good coil in the bladder.  Upon removal of the wire, purulent urine was noted to efflux from the distal stent ports.  The bladder was then drained.  The cystoscope was removed.  A B and O suppository was placed.  She was taken down from lithotomy position. Her anesthetic was reversed.  She was moved to recovery in stable condition.  There were no complications.     Excell Seltzer. Annabell Howells, M.D.     JJW/MEDQ  D:  12/01/2011  T:  12/02/2011  Job:  [id number]  cc:   Pam Drown, M.D. Fax: [phone]

## 2011-12-02 NOTE — Progress Notes (Signed)
ANTIBIOTIC CONSULT NOTE - FOLLOW-UP  Pharmacy Consult for Gentamicin Indication: Treatment levels for UTI/ureteral obstruction w/ stone  Allergies  Allergen Reactions  . Ciprofloxacin Itching    Patient Measurements: Height: 5\' 5"  (165.1 cm) Weight: 182 lb 5.1 oz (82.7 kg) IBW/kg (Calculated) : 57  Adjusted Body Weight: 68.2kg  Vital Signs: Temp: 102.8 F (39.3 C) (07/06 1735) Temp src: Oral (07/06 1735) BP: 104/72 mmHg (07/06 1440) Pulse Rate: 114  (07/06 1440) Intake/Output from previous day: 07/05 0701 - 07/06 0700 In: 2598.7 [P.O.:480; I.V.:2006.7; IV Piggyback:112] Out: 2360 [Urine:2360] Intake/Output from this shift: Total I/O In: 1080 [P.O.:1080] Out: 2150 [Urine:2150]  Labs:  Basename 12/02/11 0510 12/01/11 1050  WBC 31.3* --  HGB 12.7 --  PLT 242 --  LABCREA -- --  CREATININE 1.09 0.89   Estimated Creatinine Clearance: 70 ml/min (by C-G formula based on Cr of 1.09).  Basename 12/02/11 0829  VANCOTROUGH --  Leodis Binet --  Drue Dun --  GENTTROUGH --  GENTPEAK --  GENTRANDOM 3.1  TOBRATROUGH --  TOBRAPEAK --  TOBRARND --  AMIKACINPEAK --  AMIKACINTROU --  AMIKACIN --     Microbiology: No results found for this or any previous visit (from the past 720 hour(s)).  Medical History: History reviewed. No pertinent past medical history.  Medications:  Scheduled:     . acetaminophen      . calcium carbonate  1 tablet Oral BID  . cefTRIAXone (ROCEPHIN)  IV  1 g Intravenous Q24H  . gentamicin  480 mg Intravenous Q24H  . DISCONTD: cefTRIAXone (ROCEPHIN)  IV  1 g Intravenous Q24H   Infusions:     . sodium chloride 125 mL/hr at 12/02/11 1152  . dextrose 5 % and 0.45 % NaCl with KCl 20 mEq/L 100 mL/hr at 12/02/11 0820  . DISCONTD: sodium chloride 125 mL/hr at 12/01/11 1100  . DISCONTD: dextrose 5 % and 0.45 % NaCl with KCl 20 mEq/L    . DISCONTD: lactated ringers 125 mL/hr at 12/01/11 1832   PRN: acetaminophen, bisacodyl, HYDROmorphone  (DILAUDID) injection, hyoscyamine, ondansetron, oxyCODONE-acetaminophen, phenazopyridine, zolpidem, DISCONTD: acetaminophen, DISCONTD: bisacodyl, DISCONTD: fentaNYL, DISCONTD:  HYDROmorphone (DILAUDID) injection, DISCONTD: hyoscyamine, DISCONTD: ketorolac, DISCONTD: ondansetron, DISCONTD: oxyCODONE-acetaminophen, DISCONTD: promethazine, DISCONTD: zolpidem Assessment: 46 yo Female with pyelonephritis (ureteral obstuction with stone along with UTI)  10 hr random Gentamicin level = 3.1 which falls within appropriate dosing of current regimen of Gentamicin  Patient also on Rocephin 1gm IV q24h  Urine culture results pending  Goal of Therapy:  Drug levels according to [facility] Extended Interval dosing nomogram for Gentamicin  Plan:   Continue Gentamicin 480mg   q 24 hours (7mg /kg q 24 hr)  Monitor CrCl & recheck gentamicin level when appropriate.  Algis Lehenbauer, Joselyn Glassman, PharmD 12/02/2011,6:48 PM

## 2011-12-02 NOTE — Progress Notes (Signed)
  Subjective: Patient reports : Feels lot better than yesterday. Has suprapubic discomfort and slight dysuria.  Objective: Vital signs in last 24 hours: Temp:  [98 F (36.7 C)-101.9 F (38.8 C)] 98.3 F (36.8 C) (07/06 0629) Pulse Rate:  [99-134] 106  (07/06 0629) Resp:  [16-30] 18  (07/06 0629) BP: (104-156)/(52-87) 104/53 mmHg (07/06 0629) SpO2:  [94 %-100 %] 97 % (07/06 0629) Weight:  [182 lb 5.1 oz (82.7 kg)] 182 lb 5.1 oz (82.7 kg) (07/05 1536)  Intake/Output from previous day: 07/05 0701 - 07/06 0700 In: 2598.7 [P.O.:480; I.V.:2006.7; IV Piggyback:112] Out: 2360 [Urine:2360] Intake/Output this shift: Total I/O In: 720 [P.O.:720] Out: 500 [Urine:500]  Physical Exam:  General:Alert and oriented Lungs - Normal respiratory effort, chest expands symmetrically.  Abdomen - Soft, non-tender & non-distended WBC's 31.3 Lab Results:  Basename 12/02/11 0510  HGB 12.7  HCT 38.3   BMET  Basename 12/02/11 0510 12/01/11 1050  NA 134* 134*  K 4.2 4.8  CL 100 99  CO2 23 18*  GLUCOSE 167* 152*  BUN 13 16  CREATININE 1.09 0.89  CALCIUM 8.2* 9.7   Studies/Results: Ct Abdomen Pelvis W Contrast  12/01/2011  *RADIOLOGY REPORT*  Clinical Data: Right lower quadrant pain with nausea vomiting. Leukocytosis.  CT ABDOMEN AND PELVIS WITH CONTRAST  Technique:  Multidetector CT imaging of the abdomen and pelvis was performed following the standard protocol during bolus administration of intravenous contrast.  Contrast: OMNIPAQUE IOHEXOL 300 MG/ML  SOLN  Comparison: None.  Findings: The liver is diffusely fatty infiltrated.  Tiny low density lesion in the inferior spleen is probably a tiny cyst or pseudocyst.  The stomach, duodenum, pancreas, and adrenal glands are unremarkable.  The gallbladder is surgically absent.  The left kidney has normal imaging features.  The right kidney is diffusely edematous and there is right perinephric edema/fluid.  These changes are secondary to an 11 x 6 x  10 mm stone lodged at the right UPJ.  Decreased perfusion to the right kidney is compatible with obstructive uropathy.  No other stone is seen in the right ureter.  There is no left ureteral or bladder stones.  No abdominal aortic aneurysm.  No free fluid or lymphadenopathy in the abdomen.  The abdominal bowel loops are unremarkable.  Imaging through the pelvis shows a small amount of intraperitoneal free fluid.  The uterus has a marked  lobular contour consistent with fibroid change and there is a fairly prominent 4 cm submucosal fibroid.  Several large posterior subserosal fibroids are identified, measuring up to 5.6 cm in diameter.  There is no adnexal mass.  No substantial diverticular change in the colon.  Terminal ileum is normal.  The appendix is normal.  Bone windows reveal no worrisome lytic or sclerotic osseous lesions.  IMPRESSION: Secondary changes with obstructive uropathy in the right kidney due to and 11 x 6 x 10 mm stone lodged at the right UPJ.  Normal appendix.  I called these findings to Dr Weldon Inches in the emergency department at 1353 hours on 12/01/2011.  Original Report Authenticated By: ERIC A. MANSELL, M.D.    Assessment/Plan:  UTI  Right UPJ calculus  S/P cystoscopy, right retrograde pyelogram, JJ stent placement.  Plan:  Continue IV antibiotics.            Await urine culture results for appropriate discharge antibiotics.   LOS: 1 day   Monnie Anspach-HENRY 12/02/2011, 10:31 AM

## 2011-12-03 LAB — CBC
HCT: 35.9 % — ABNORMAL LOW (ref 36.0–46.0)
Hemoglobin: 12.3 g/dL (ref 12.0–15.0)
MCV: 81.6 fL (ref 78.0–100.0)
RBC: 4.4 MIL/uL (ref 3.87–5.11)
WBC: 21.9 10*3/uL — ABNORMAL HIGH (ref 4.0–10.5)

## 2011-12-03 LAB — BASIC METABOLIC PANEL
BUN: 9 mg/dL (ref 6–23)
CO2: 20 mEq/L (ref 19–32)
Chloride: 103 mEq/L (ref 96–112)
Creatinine, Ser: 0.87 mg/dL (ref 0.50–1.10)
Glucose, Bld: 124 mg/dL — ABNORMAL HIGH (ref 70–99)

## 2011-12-03 LAB — URINE CULTURE

## 2011-12-03 NOTE — Progress Notes (Signed)
Subjective: Patient reports No pain today. T: 97.4  Max 102.8 yesterday Slight dysuria.  Objective: Vital signs in last 24 hours: Temp:  [97.4 F (36.3 C)-102.8 F (39.3 C)] 97.4 F (36.3 C) (07/07 0628) Pulse Rate:  [100-125] 100  (07/07 0628) Resp:  [18-20] 18  (07/07 0628) BP: (104-116)/(72-78) 111/78 mmHg (07/07 0628) SpO2:  [93 %-95 %] 95 % (07/07 0628)  Intake/Output from previous day: 07/06 0701 - 07/07 0700 In: 1080 [P.O.:1080] Out: 4150 [Urine:4150] Intake/Output this shift: Total I/O In: 720 [P.O.:720] Out: 275 [Urine:275]  Physical Exam:  General:Alert and oriented.  Feels better this morning. Lungs - Normal respiratory effort, chest expands symmetrically.  Abdomen - Soft, non-tender & non-distended Voids well.  Urine clear Urine culture: >100,000 col Klebsiella Sensitive to Ceftriaxone and Gentamicin. White count down to 21.9 from 31.3.                        Lab Results:  Basename 12/03/11 0450 12/02/11 0510  HGB 12.3 12.7  HCT 35.9* 38.3   BMET  Basename 12/03/11 0450 12/02/11 0510  NA 134* 134*  K 3.9 4.2  CL 103 100  CO2 20 23  GLUCOSE 124* 167*  BUN 9 13  CREATININE 0.87 1.09  CALCIUM 8.5 8.2*   No results found for this basename: LABPT:3,INR:3 in the last 72 hours No results found for this basename: LABURIN:1 in the last 72 hours Results for orders placed during the hospital encounter of 12/01/11  URINE CULTURE     Status: Normal   Collection Time   12/01/11  2:21 PM      Component Value Range Status Comment   Specimen Description URINE, CLEAN CATCH   Final    Special Requests NONE   Final    Culture  Setup Time 12/02/2011 01:43   Final    Colony Count >=100,000 COLONIES/ML   Final    Culture KLEBSIELLA PNEUMONIAE   Final    Report Status 12/03/2011 FINAL   Final    Organism ID, Bacteria KLEBSIELLA PNEUMONIAE   Final     Studies/Results: Ct Abdomen Pelvis W Contrast  12/01/2011  *RADIOLOGY REPORT*  Clinical Data: Right lower  quadrant pain with nausea vomiting. Leukocytosis.  CT ABDOMEN AND PELVIS WITH CONTRAST  Technique:  Multidetector CT imaging of the abdomen and pelvis was performed following the standard protocol during bolus administration of intravenous contrast.  Contrast: OMNIPAQUE IOHEXOL 300 MG/ML  SOLN  Comparison: None.  Findings: The liver is diffusely fatty infiltrated.  Tiny low density lesion in the inferior spleen is probably a tiny cyst or pseudocyst.  The stomach, duodenum, pancreas, and adrenal glands are unremarkable.  The gallbladder is surgically absent.  The left kidney has normal imaging features.  The right kidney is diffusely edematous and there is right perinephric edema/fluid.  These changes are secondary to an 11 x 6 x 10 mm stone lodged at the right UPJ.  Decreased perfusion to the right kidney is compatible with obstructive uropathy.  No other stone is seen in the right ureter.  There is no left ureteral or bladder stones.  No abdominal aortic aneurysm.  No free fluid or lymphadenopathy in the abdomen.  The abdominal bowel loops are unremarkable.  Imaging through the pelvis shows a small amount of intraperitoneal free fluid.  The uterus has a marked  lobular contour consistent with fibroid change and there is a fairly prominent 4 cm submucosal fibroid.  Several large posterior  subserosal fibroids are identified, measuring up to 5.6 cm in diameter.  There is no adnexal mass.  No substantial diverticular change in the colon.  Terminal ileum is normal.  The appendix is normal.  Bone windows reveal no worrisome lytic or sclerotic osseous lesions.  IMPRESSION: Secondary changes with obstructive uropathy in the right kidney due to and 11 x 6 x 10 mm stone lodged at the right UPJ.  Normal appendix.  I called these findings to Dr Weldon Inches in the emergency department at 1353 hours on 12/01/2011.  Original Report Authenticated By: ERIC A. MANSELL, M.D.    Assessment/Plan:  UTI  Right ureteral  calculus  Right UPJ calculus                                            PLAN: Continue IV Rocephin and Gentamicin             Home in AM if remains afebrile             Is allergic to Cipro.  Will probably go home on [id number]-TMP.   LOS: 2 days   [name] 12/03/2011, 9:50 AM

## 2011-12-04 ENCOUNTER — Other Ambulatory Visit: Payer: Self-pay | Admitting: Urology

## 2011-12-04 MED ORDER — HYOSCYAMINE SULFATE 0.125 MG SL SUBL: 0.1250 mg | SUBLINGUAL_TABLET | Freq: Four times a day (QID) | SUBLINGUAL | Status: DC | PRN

## 2011-12-04 MED ORDER — HYDROCODONE-ACETAMINOPHEN 5-325 MG PO TABS: 1.0000 | ORAL_TABLET | Freq: Four times a day (QID) | ORAL | Status: DC | PRN

## 2011-12-04 MED ORDER — PHENAZOPYRIDINE HCL 200 MG PO TABS: 200.0000 mg | ORAL_TABLET | Freq: Three times a day (TID) | ORAL | Status: AC | PRN

## 2011-12-04 MED ORDER — SULFAMETHOXAZOLE-TMP DS 800-160 MG PO TABS: 1.0000 | ORAL_TABLET | Freq: Two times a day (BID) | ORAL | Status: AC

## 2011-12-04 NOTE — Discharge Summary (Signed)
Physician Discharge Summary  Patient ID: KYLIN GENNA MRN: [id number] DOB/AGE: [birth year] 46 y.o.  Admit date: 12/01/2011 Discharge date: 12/04/2011  Admission Diagnoses: right proximal ureteral stone with pyelonephritis  Discharge Diagnoses: same Active Problems:  * No active hospital problems. *    Discharged Condition: good  Hospital Course: Merci was admitted on 12/01/11 with an 11mm right proximal ureteral/UPJ stone with obstruction and pyelonephritis.   She underwent stent placement on Friday and was placed on Ceftriaxone and Gentamycin pending her culture which eventually grew a Klebsiella.   She still had fever to >102 yesterday but her WBC count had declined to 21K from 31K and she was feeling much better.  She is afebrile today and ready for discharge.  She will have ESWL on 12/11/11.  Consults: None  Significant Diagnostic Studies: CT scan and urine culture  Treatments: surgery: Cystoscopy with right ureteral stenting.  Discharge Exam: Blood pressure 110/75, pulse 80, temperature 98.4 F (36.9 C), temperature source Oral, resp. rate 16, height 5\' 5"  (1.651 m), weight 82.7 kg (182 lb 5.1 oz), last menstrual period 11/17/2011, SpO2 97.00%. General appearance: alert and no distress  Disposition: Home with ESWL scheduled next week.   Medication List  As of 12/04/2011  7:46 AM   TAKE these medications         calcium carbonate 500 MG chewable tablet   Commonly known as: TUMS - dosed in mg elemental calcium   Chew 1 tablet by mouth 2 (two) times daily.      HYDROcodone-acetaminophen 5-325 MG per tablet   Commonly known as: NORCO   Take 1 tablet by mouth every 6 (six) hours as needed for pain.      hyoscyamine 0.125 MG SL tablet   Commonly known as: LEVSIN SL   Take 1 tablet (0.125 mg total) by mouth every 6 (six) hours as needed.      phenazopyridine 200 MG tablet   Commonly known as: PYRIDIUM   Take 1 tablet (200 mg total) by mouth 3 (three) times daily as  needed (burning).      sulfamethoxazole-trimethoprim 800-160 MG per tablet   Commonly known as: BACTRIM DS   Take 1 tablet by mouth 2 (two) times daily.           Follow-up Information    Follow up with Anner Crete, MD. (lithotripsy a week from Monday.)    Contact information:   [address] [place] [address] [place] [address] [phone]          Signed: Anner Crete 12/04/2011, 7:46 AM

## 2011-12-04 NOTE — Care Management Note (Addendum)
    Page 1 of 1   12/04/2011     9:40:11 AM   CARE MANAGEMENT NOTE 12/04/2011  Patient:  [name], [name]   Account Number:  [id number]  Date Initiated:  12/04/2011  Documentation initiated by:  [facility]  Subjective/Objective Assessment:   ADMITTED W/R FLANK PAIN.     Action/Plan:   FROM HOME   Anticipated DC Date:  12/04/2011   Anticipated DC Plan:  HOME/SELF CARE      DC Planning Services  CM consult      Choice offered to / List presented to:             Status of service:  Completed, signed off Medicare Important Message given?   (If response is "NO", the following Medicare IM given date fields will be blank) Date Medicare IM given:   Date Additional Medicare IM given:    Discharge Disposition:  HOME/SELF CARE  Per UR Regulation:  Reviewed for med. necessity/level of care/duration of stay  If discussed at Long Length of Stay Meetings, dates discussed:    Comments:

## 2011-12-06 NOTE — Anesthesia Postprocedure Evaluation (Signed)
  Anesthesia Post-op Note  Patient: [name]  Procedure(s) Performed: Procedure(s) (LRB): CYSTOSCOPY WITH STENT PLACEMENT (Right)  Patient Location: PACU  Anesthesia Type: General  Level of Consciousness: awake and alert   Airway and Oxygen Therapy: Patient Spontanous Breathing  Post-op Pain: mild  Post-op Assessment: Post-op Vital signs reviewed, Patient's Cardiovascular Status Stable, Respiratory Function Stable, Patent Airway and No signs of Nausea or vomiting  Post-op Vital Signs: stable  Complications: No apparent anesthesia complications

## 2011-12-07 ENCOUNTER — Encounter (HOSPITAL_COMMUNITY): Payer: Self-pay | Admitting: Pharmacy Technician

## 2011-12-07 ENCOUNTER — Encounter (HOSPITAL_COMMUNITY): Payer: Self-pay | Admitting: *Deleted

## 2011-12-07 NOTE — Progress Notes (Signed)
1455 Pre op instructions given bring blue folder sign all forms,insurance card, have a driver to take home, do not take Ibuprofen, Advil, Aspirin products or Toradol 72 hours prior to procedure.  Procedure take about an hour and 15 minutes.  Wear comfortable clothing. No solid food after midnight may have   clear liquids until 1015  then NPO for procedure and may take a pain pill if needed with a sip of water.

## 2011-12-08 NOTE — Progress Notes (Signed)
Spoke with Lossie Faes. Re: no pre Litho antibiotic order she stated she spoke with doctor, he is aware and no new orders given.

## 2011-12-08 NOTE — Progress Notes (Signed)
1455 12-07-11 Asked to bring blue folder back for procedure sign all forms,insurance card,ID, eat a light dinner 12-10-11 between 5 pm and 6 pm.  Clear liquids until 1015 then NPO for procedure 12-10-11 may take pain med if needed, no Aspirin, Advil, Ibuprofen,Toradol 72 hours prior to procedure

## 2011-12-11 ENCOUNTER — Ambulatory Visit (HOSPITAL_COMMUNITY)
Admission: RE | Admit: 2011-12-11 | Discharge: 2011-12-11 | Disposition: A | Discharge status: Home / Self Care | Payer: 59 | Source: Ambulatory Visit | Attending: Urology | Admitting: Urology

## 2011-12-11 ENCOUNTER — Encounter (HOSPITAL_COMMUNITY)
Admission: RE | Disposition: A | Discharge status: Home / Self Care | Payer: Self-pay | Source: Ambulatory Visit | Attending: Urology

## 2011-12-11 ENCOUNTER — Ambulatory Visit (HOSPITAL_COMMUNITY): Payer: 59

## 2011-12-11 ENCOUNTER — Encounter (HOSPITAL_COMMUNITY): Payer: Self-pay | Admitting: *Deleted

## 2011-12-11 DIAGNOSIS — R112 Nausea with vomiting, unspecified: Secondary | ICD-10-CM | POA: Insufficient documentation

## 2011-12-11 DIAGNOSIS — R197 Diarrhea, unspecified: Secondary | ICD-10-CM | POA: Insufficient documentation

## 2011-12-11 DIAGNOSIS — N200 Calculus of kidney: Secondary | ICD-10-CM | POA: Insufficient documentation

## 2011-12-11 DIAGNOSIS — M7989 Other specified soft tissue disorders: Secondary | ICD-10-CM | POA: Insufficient documentation

## 2011-12-11 DIAGNOSIS — R509 Fever, unspecified: Secondary | ICD-10-CM | POA: Insufficient documentation

## 2011-12-11 SURGERY — LITHOTRIPSY, ESWL
Anesthesia: LOCAL | Laterality: Left

## 2011-12-11 MED ORDER — OXYCODONE HCL 5 MG PO TABS
ORAL_TABLET | ORAL | Status: AC
  Filled 2011-12-11: qty 1

## 2011-12-11 MED ORDER — HYDROCODONE-ACETAMINOPHEN 5-325 MG PO TABS: 1.0000 | ORAL_TABLET | Freq: Four times a day (QID) | ORAL | Status: AC | PRN

## 2011-12-11 MED ORDER — DIAZEPAM 5 MG PO TABS
10.0000 mg | ORAL_TABLET | ORAL | Status: AC
  Administered 2011-12-11: 10 mg via ORAL

## 2011-12-11 MED ORDER — DEXTROSE-NACL 5-0.45 % IV SOLN
INTRAVENOUS | Status: DC
  Administered 2011-12-11: 15:00:00 via INTRAVENOUS

## 2011-12-11 MED ORDER — DIPHENHYDRAMINE HCL 25 MG PO CAPS
25.0000 mg | ORAL_CAPSULE | ORAL | Status: AC
  Administered 2011-12-11: 25 mg via ORAL

## 2011-12-11 MED ORDER — DIAZEPAM 5 MG PO TABS
ORAL_TABLET | ORAL | Status: AC
  Administered 2011-12-11: 10 mg via ORAL
  Filled 2011-12-11: qty 2

## 2011-12-11 MED ORDER — DIPHENHYDRAMINE HCL 25 MG PO CAPS
ORAL_CAPSULE | ORAL | Status: AC
  Administered 2011-12-11: 25 mg via ORAL
  Filled 2011-12-11: qty 1

## 2011-12-11 MED ORDER — OXYCODONE HCL 5 MG PO TABS
5.0000 mg | ORAL_TABLET | ORAL | Status: DC | PRN
  Administered 2011-12-11: 5 mg via ORAL

## 2011-12-11 NOTE — H&P (View-Only) (Signed)
Urology Admission H&P  Chief Complaint: right flank pain  History of Present Illness: Kaitlin Kaitlin is a 45 yo WF who had  Some pain under the right shoulder blade Saturday and then 2 am today she had the onset of severe right flank pain with radiation to the hip.  She was having N/V and diarrhea and has had a low grade fever.  She had some voiding symptoms with a little pinprick sensation in the urethral area.   She saw Dr. Corliss Kaitlin who found an infected urine and a WBC of 20.8.  She was sent for imaging and was found to have an 11mm RUPJ stone with obstruction.   She continues to have pain.  She has had prior UTI's but no stones or GU surgery.  She does have a family history of stones.  History reviewed. No pertinent past medical history. Past Surgical History  Procedure Date  . Cholecystectomy     Home Medications:  Prescriptions prior to admission  Medication Sig Dispense Refill  . calcium carbonate (TUMS - DOSED IN MG ELEMENTAL CALCIUM) 500 MG chewable tablet Chew 1 tablet by mouth 2 (two) times daily.       Allergies:  Allergies  Allergen Reactions  . Ciprofloxacin Itching    History reviewed. No pertinent family history. Social History:  reports that she has never smoked. She does not have any smokeless tobacco history on file. She reports that she drinks alcohol. Her drug history not on file.  Review of Systems  Constitutional: Positive for fever.  Cardiovascular: Positive for leg swelling.  Gastrointestinal: Positive for nausea, vomiting, abdominal pain and diarrhea.  All other systems reviewed and are negative.    Physical Exam:  Vital signs in last 24 hours: Temp:  [98 F (36.7 C)-98.7 F (37.1 C)] 98.5 F (36.9 C) (07/05 1536) Pulse Rate:  [99-120] 120  (07/05 1536) Resp:  [16-20] 16  (07/05 1536) BP: (121-156)/(78-87) 121/78 mmHg (07/05 1536) SpO2:  [94 %-99 %] 97 % (07/05 1536) Weight:  [82.7 kg (182 lb 5.1 oz)] 82.7 kg (182 lb 5.1 oz) (07/05 1536) Physical Exam    Constitutional: She is oriented to person, place, and time. She appears well-developed and well-nourished. She appears distressed.  HENT:  Head: Normocephalic and atraumatic.  Neck: Normal range of motion. Neck supple. No thyromegaly present.  Cardiovascular: Normal rate.        With a mild tachycardia.  Respiratory: Effort normal and breath sounds normal.  GI: Soft. There is tenderness (right upper and lower quadrants.). There is rebound.  Musculoskeletal: Normal range of motion.  Neurological: She is alert and oriented to person, place, and time.  Skin: Skin is warm and dry.  Psychiatric: She has a normal mood and affect.    Laboratory Data:  Results for orders placed during the hospital encounter of 12/01/11 (from the past 24 hour(s))  BASIC METABOLIC PANEL     Status: Abnormal   Collection Time   12/01/11 10:50 AM      Component Value Range   Sodium 134 (*) 135 - 145 mEq/L   Potassium 4.8  3.5 - 5.1 mEq/L   Chloride 99  96 - 112 mEq/L   CO2 18 (*) 19 - 32 mEq/L   Glucose, Bld 152 (*) 70 - 99 mg/dL   BUN 16  6 - 23 mg/dL   Creatinine, Ser 1.47  0.50 - 1.10 mg/dL   Calcium 9.7  8.4 - 82.9 mg/dL   GFR calc non Af Amer 78 (*) >  90 mL/min   GFR calc Af Amer >90  >90 mL/min  URINALYSIS, ROUTINE W REFLEX MICROSCOPIC     Status: Abnormal   Collection Time   12/01/11  1:09 PM      Component Value Range   Color, Urine YELLOW  YELLOW   APPearance CLOUDY (*) CLEAR   Specific Gravity, Urine 1.021  1.005 - 1.030   pH 7.5  5.0 - 8.0   Glucose, UA NEGATIVE  NEGATIVE mg/dL   Hgb urine dipstick MODERATE (*) NEGATIVE   Bilirubin Urine NEGATIVE  NEGATIVE   Ketones, ur NEGATIVE  NEGATIVE mg/dL   Protein, ur 981 (*) NEGATIVE mg/dL   Urobilinogen, UA 0.2  0.0 - 1.0 mg/dL   Nitrite NEGATIVE  NEGATIVE   Leukocytes, UA LARGE (*) NEGATIVE  PREGNANCY, URINE     Status: Normal   Collection Time   12/01/11  1:09 PM      Component Value Range   Preg Test, Ur NEGATIVE  NEGATIVE  URINE  MICROSCOPIC-ADD ON     Status: Abnormal   Collection Time   12/01/11  1:09 PM      Component Value Range   Squamous Epithelial / LPF RARE  RARE   WBC, UA TOO NUMEROUS TO COUNT  <3 WBC/hpf   RBC / HPF 7-10  <3 RBC/hpf   Bacteria, UA MANY (*) RARE   No results found for this or any previous visit (from the past 240 hour(s)). Creatinine:  Basename 12/01/11 1050  CREATININE 0.89   I have reviewed her films and labs from Dr. Uvaldo Kaitlin.  Impression/Assessment:  11mm RUPJ stone with UTI and fever.  Plan:  I am going to take her for cystoscopy with right ureteral stenting.  The risks of bleeding, infection and ureteral injury reviewed as well as the risk of thrombotic events and anesthetic complications.  Kaitlin Kaitlin 12/01/2011, 4:55 PM

## 2011-12-11 NOTE — Interval H&P Note (Signed)
History and Physical Interval Note:  12/11/2011 4:49 PM  [name]  has presented today for surgery, with the diagnosis of left proximal stone  The various methods of treatment have been discussed with the patient and family. After consideration of risks, benefits and other options for treatment, the patient has consented to  Procedure(s) (LRB): EXTRACORPOREAL SHOCK WAVE LITHOTRIPSY (ESWL) (Left) as a surgical intervention .  The patient's history has been reviewed, patient examined, no change in status, stable for surgery.  I have reviewed the patients' chart and labs.  Questions were answered to the patient's satisfaction.     Ravi Tuccillo J

## 2011-12-11 NOTE — Interval H&P Note (Signed)
History and Physical Interval Note:  12/11/2011 2:47 PM  [name]  has presented today for surgery, with the diagnosis of left proximal stone  The various methods of treatment have been discussed with the patient and family. After consideration of risks, benefits and other options for treatment, the patient has consented to  Procedure(s) (LRB): EXTRACORPOREAL SHOCK WAVE LITHOTRIPSY (ESWL) (Left) as a surgical intervention .  The patient's history has been reviewed, patient examined, no change in status, stable for surgery.  I have reviewed the patients' chart and labs.  Questions were answered to the patient's satisfaction.     [name]   Her stone is on the right and the consent will be changed.   I discussed this with her short stay nurse.

## 2011-12-11 NOTE — Progress Notes (Signed)
Consent ordered for left stone. Per patient (and progress notes in computer) stone on the right. Called Dr. Annabell Howells and order changed for right kidney stone.

## 2018-11-13 ENCOUNTER — Other Ambulatory Visit: Payer: Self-pay | Admitting: Physician Assistant

## 2018-11-13 DIAGNOSIS — R29898 Other symptoms and signs involving the musculoskeletal system: Secondary | ICD-10-CM

## 2018-11-13 DIAGNOSIS — M899 Disorder of bone, unspecified: Secondary | ICD-10-CM

## 2018-11-14 ENCOUNTER — Other Ambulatory Visit: Payer: Self-pay | Admitting: Physician Assistant

## 2018-11-14 DIAGNOSIS — Z1231 Encounter for screening mammogram for malignant neoplasm of breast: Secondary | ICD-10-CM

## 2018-11-14 DIAGNOSIS — R29898 Other symptoms and signs involving the musculoskeletal system: Secondary | ICD-10-CM

## 2018-11-14 DIAGNOSIS — M899 Disorder of bone, unspecified: Secondary | ICD-10-CM

## 2018-11-14 DIAGNOSIS — Z87891 Personal history of nicotine dependence: Secondary | ICD-10-CM

## 2018-11-16 ENCOUNTER — Other Ambulatory Visit: Payer: Self-pay

## 2018-11-16 ENCOUNTER — Ambulatory Visit
Admission: RE | Admit: 2018-11-16 | Discharge: 2018-11-16 | Disposition: A | Discharge status: Home / Self Care | Payer: BC Managed Care – PPO | Source: Ambulatory Visit | Attending: Physician Assistant | Admitting: Physician Assistant

## 2018-11-16 DIAGNOSIS — Z1231 Encounter for screening mammogram for malignant neoplasm of breast: Secondary | ICD-10-CM

## 2018-11-18 ENCOUNTER — Ambulatory Visit (INDEPENDENT_AMBULATORY_CARE_PROVIDER_SITE_OTHER): Payer: BC Managed Care – PPO

## 2018-11-18 ENCOUNTER — Other Ambulatory Visit: Payer: Self-pay

## 2018-11-18 DIAGNOSIS — Z87891 Personal history of nicotine dependence: Secondary | ICD-10-CM | POA: Diagnosis not present

## 2018-11-18 DIAGNOSIS — R29898 Other symptoms and signs involving the musculoskeletal system: Secondary | ICD-10-CM

## 2018-11-18 DIAGNOSIS — M899 Disorder of bone, unspecified: Secondary | ICD-10-CM

## 2018-11-18 DIAGNOSIS — M898X9 Other specified disorders of bone, unspecified site: Secondary | ICD-10-CM

## 2018-11-18 MED ORDER — GADOBUTROL 1 MMOL/ML IV SOLN
7.0000 mL | Freq: Once | INTRAVENOUS | Status: AC | PRN
  Administered 2018-11-18: 7 mL via INTRAVENOUS

## 2018-11-19 ENCOUNTER — Other Ambulatory Visit: Payer: Self-pay | Admitting: Physician Assistant

## 2018-11-19 DIAGNOSIS — R928 Other abnormal and inconclusive findings on diagnostic imaging of breast: Secondary | ICD-10-CM

## 2018-11-20 ENCOUNTER — Ambulatory Visit
Admission: RE | Admit: 2018-11-20 | Discharge: 2018-11-20 | Disposition: A | Discharge status: Home / Self Care | Payer: BC Managed Care – PPO | Source: Ambulatory Visit | Attending: Physician Assistant | Admitting: Physician Assistant

## 2018-11-20 ENCOUNTER — Telehealth: Payer: Self-pay | Admitting: Hematology

## 2018-11-20 ENCOUNTER — Other Ambulatory Visit: Payer: Self-pay

## 2018-11-20 ENCOUNTER — Other Ambulatory Visit: Payer: Self-pay | Admitting: Physician Assistant

## 2018-11-20 DIAGNOSIS — R928 Other abnormal and inconclusive findings on diagnostic imaging of breast: Secondary | ICD-10-CM

## 2018-11-20 DIAGNOSIS — N6320 Unspecified lump in the left breast, unspecified quadrant: Secondary | ICD-10-CM

## 2018-11-20 NOTE — Telephone Encounter (Signed)
Received a call from [place] at [facility] for lytic bone lesions. Pt has been scheduled to see Dr. [facility] on 7/6 at [place] will notify the pt.

## 2018-11-22 ENCOUNTER — Other Ambulatory Visit: Payer: Self-pay

## 2018-11-22 ENCOUNTER — Ambulatory Visit
Admission: RE | Admit: 2018-11-22 | Discharge: 2018-11-22 | Disposition: A | Discharge status: Home / Self Care | Payer: BC Managed Care – PPO | Source: Ambulatory Visit | Attending: Physician Assistant | Admitting: Physician Assistant

## 2018-11-22 DIAGNOSIS — N6320 Unspecified lump in the left breast, unspecified quadrant: Secondary | ICD-10-CM

## 2018-11-27 ENCOUNTER — Telehealth: Payer: Self-pay | Admitting: *Deleted

## 2018-11-27 NOTE — Telephone Encounter (Signed)
Contacted [facility]. Patient has appt w/Dr. Irene Limbo on Monday. Results from biopsy of L breast should be in Epic tomorrow.

## 2018-11-28 NOTE — Progress Notes (Signed)
HEMATOLOGY/ONCOLOGY CONSULTATION NOTE  Date of Service: 12/02/2018  Patient Care Team: Etter Sjogren as PCP - General (Physician Assistant)  CHIEF COMPLAINTS/PURPOSE OF CONSULTATION:  Newly Diagnosed Breast Cancer  HISTORY OF PRESENTING ILLNESS:   [name] is a wonderful 53 y.o. female who has been referred to [place] by Kaitlin Pitt, PA for evaluation and management of her Newly diagnosed Breast Cancer. She is accompanied today by her sister, [name] via [employer]. The pt reports that she is doing well overall.  The pt notes that she first began having new lower back soreness in December 2019. She notes that she was lifting a heavy box of cat litter in roughly January 2020 and felt a pulling in her right chest which later improved. She notes that her lower back pain began radiating down into her right leg through the right knee in March, with a focused component of pain in the mid right femur. She notes that her pain began regressing and would have a few days of improvement each week, which she associated with increased activity. Then around the end of April, the pt notes that her pain became much worse. She used exercises for her hips, thighs, and knees and again experienced improvement. She then saw family who encouraged her to seek medical attention in early June 2020. The pt denies any weakness in her legs prior to her recent pains and bone lesions. The pt notes that her left leg has become more weak in the last month or so. She denies headaches. The pt notes that her vision worsened last summer of 2019, nothing more recently. She last saw an ophthalmologist in 2011. The pt currently experiences lower back, bilateral hip, and right upper leg and knee pain. She feels that her right hip has raised in height.The pt notes that she has lost 40 pounds in the last 4 months. She has been using '10mg'$  Oxycodone every 4 hours, except while sleeping, thus 5 per day. She began  Oxycodone on June 25. She notes that her pain is a 4-6 out of 10, which allows her to function. Her pain increases to an 8 at the end of each 4 hours. She continues on Meloxicam as well which she notes has been very helpful for her.   The pt denies skin changes over the breast. She denies nipple inversion nor discharge. She notes that her breast mass was not noticed by her until after her mammogram, and is difficult to palpate.  The pt notes that she had never had a mammogram prior to her June 2020 mammogram. She has completed mail in screening for colon cancer, the results of which are pending. She has not yet had a colonoscopy.  The pt notes that she is also considering treatment in [place] as her family lives in [place].  On Past Medical History, the pt reports a history of scoliosis with left leg 0.5 inches longer translating into lower lumbar pain. The patient's notes that she attributed her recent lower lumbar pain to her history of scoliosis. The pt also has a history of kidney stones in 2013 and was treated with lithotripsy. She had a cholecystectomy in 2010. Endorses left forearm lipoma which was resected several years ago. Besides these, the pt denies any other surgeries or medical concerns. She has not been pregnant before.  On Social History, the pt reports that she smoked cigarettes, beginning at age 50. Intermittent smoking since 2004, but when smoking is up to 1ppd. She  is currently not smoking. She has quit for possibly 8 years total, thus about 1ppd for 20 years. She denies much alcohol consumption. She has worked as a sixth Public house manager. She is not married.  On Family History, the patient reports Paternal 83 with breast cancer at age 66, and her daughter (the patient's cousin) with breast cancer at age 19. The pt "thinks, not certain," that family have had BRCA gene mutation. Father died of melanoma diagnosed in early-mid 3s, metastasized at age 64. Paternal grandmother  with ovarian cancer. Her father has three sisters, all of whom have had basal cells. Paternal grandfather with basal cells.  Pt endorses hives allergic reaction to Ciprofloaxacin.  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  Most recent lab results (11/13/18) of CBC w/diff is as follows: all values are WNL except for WBC at 14.3k, RDW at 16.3, PLT at 712k, MPV at 7.2, ANC at 9.8k. 11/13/18 CMP revealed all values WNL except for CO2 at 19, Calcium at 10.9, ALP at 317, AST at 45 11/13/18 Sed Rate at 74 11/08/18 SPEP and IFE revealed all values WNL except for Alpha-2 globulin at 1.5  On review of systems, pt reports lower back pain, right leg pain, bilateral hip pain, left leg weakness, 40 pound unexpected weight loss, and denies chest wall pain, headaches, recent changes in vision, abdominal pains, and any other symptoms.   MEDICAL HISTORY:   On Past Medical History, the pt reports a history of scoliosis with left leg 0.5 inches longer translating into lower lumbar pain. The patient's notes that she attributed her recent lower lumbar pain to her history of scoliosis. The pt also has a history of kidney stones in 2013 and was treated with lithotripsy. She had a cholecystectomy in 2010. Endorses left forearm lipoma which was resected several years ago. Besides these, the pt denies any other surgeries or medical concerns. She has not been pregnant before.  SURGICAL HISTORY: Past Surgical History:  Procedure Laterality Date   CHOLECYSTECTOMY      SOCIAL HISTORY:  On Social History, the pt reports that she smoked cigarettes, beginning at age 7. Intermittent smoking since 2004, but when smoking is up to 1ppd. She is currently not smoking. She has quit for possibly 8 years total, thus about 1ppd for 20 years. She  denies much alcohol consumption. She has worked as a sixth Public house manager. She is not married.   Social History   Socioeconomic History   Marital status: Single    Spouse name: Not on file   Number of children: Not on file   Years of education: Not on file   Highest education level: Not on file  Occupational History   Not on file  Social Needs   Financial resource strain: Not on file   Food insecurity    Worry: Not on file    Inability: Not on file   Transportation needs    Medical: Not on file    Non-medical: Not on file  Tobacco Use   Smoking status: Former Smoker  Substance and Sexual Activity   Alcohol use: Yes    Comment: occasionally   Drug use: No   Sexual activity: Not on file  Lifestyle   Physical activity    Days per week: Not on file    Minutes  per session: Not on file   Stress: Not on file  Relationships   Social connections    Talks on phone: Not on file    Gets together: Not on file    Attends religious service: Not on file    Active member of club or organization: Not on file    Attends meetings of clubs or organizations: Not on file    Relationship status: Not on file   Intimate partner violence    Fear of current or ex partner: Not on file    Emotionally abused: Not on file    Physically abused: Not on file    Forced sexual activity: Not on file  Other Topics Concern   Not on file  Social History Narrative   Not on file    FAMILY HISTORY: Family History  Problem Relation Age of Onset   Breast cancer Paternal Aunt    Breast cancer Other    On Family History, the patient reports Paternal 72 with breast cancer at age 49, and her daughter (the patient's cousin) with breast cancer at age 41. The pt "thinks, not certain," that family have had BRCA gene mutation. Father died of melanoma diagnosed in early-mid 32s, metastasized at age 33. Paternal grandmother with ovarian cancer. Her father has three sisters, all of whom  have had basal cells. Paternal grandfather with basal cells.  ALLERGIES:  is allergic to ciprofloxacin.  MEDICATIONS:  Current Outpatient Medications  Medication Sig Dispense Refill   calcium carbonate (TUMS - DOSED IN MG ELEMENTAL CALCIUM) 500 MG chewable tablet Chew 1 tablet by mouth 2 (two) times daily.     hyoscyamine (LEVSIN SL) 0.125 MG SL tablet Take 0.125 mg by mouth every 6 (six) hours as needed. For cramping     Oxycodone HCl 10 MG TABS Take 1 tablet (10 mg total) by mouth every 4 (four) hours as needed. 90 tablet 0   No current facility-administered medications for this visit.     REVIEW OF SYSTEMS:    10 Point review of Systems was done is negative except as noted above.  PHYSICAL EXAMINATION: ECOG PERFORMANCE STATUS: 1 to 2.  . Vitals:   12/02/18 1114  BP: 132/78  Pulse: (!) 105  Resp: 18  Temp: 98 F (36.7 C)  SpO2: 93%   Filed Weights   12/02/18 1114  Weight: 154 lb 3.2 oz (69.9 kg)   .Body mass index is 25.66 kg/m.  GENERAL:alert, in no acute distress and comfortable SKIN: no acute rashes, no significant lesions EYES: conjunctiva are pink and non-injected, sclera anicteric OROPHARYNX: MMM, no exudates, no oropharyngeal erythema or ulceration NECK: supple, no JVD LYMPH: no palpable lymphadenopathy in the cervical, axillary or inguinal regions LUNGS: clear to auscultation b/l with normal respiratory effort HEART: regular rate & rhythm ABDOMEN:  normoactive bowel sounds , non tender, not distended. No palpable hepatosplenomegaly. Extremity: no pedal edema PSYCH: alert & oriented x 3 with fluent speech NEURO: no focal motor/sensory deficits  LABORATORY DATA:  I have reviewed the data as listed  CBC Latest Ref Rng & Units 12/02/2018 12/03/2011 12/02/2011  WBC 4.0 - 10.5 K/uL 12.6(H) 21.9(H) 31.3(H)  Hemoglobin 12.0 - 15.0 g/dL 12.2 12.3 12.7  Hematocrit 36.0 - 46.0 % 37.9 35.9(L) 38.3  Platelets 150 - 400 K/uL 431(H) 202 242    CMP Latest Ref Rng &  Units 12/03/2011 12/02/2011 12/01/2011  Glucose 70 - 99 mg/dL 124(H) 167(H) 152(H)  BUN 6 - 23 mg/dL 9 13 16  Creatinine 0.50 - 1.10 mg/dL 0.87 1.09 0.89  Sodium 135 - 145 mEq/L 134(L) 134(L) 134(L)  Potassium 3.5 - 5.1 mEq/L 3.9 4.2 4.8  Chloride 96 - 112 mEq/L 103 100 99  CO2 19 - 32 mEq/L 20 23 18(L)  Calcium 8.4 - 10.5 mg/dL 8.5 8.2(L) 9.7    11/22/18 Left Breast biopsy:    11/13/18 Labs with PCP:      RADIOGRAPHIC STUDIES: I have personally reviewed the radiological images as listed and agreed with the findings in the report. Dg Chest 2 View  Result Date: 11/18/2018 CLINICAL DATA:  Lytic bone lesions. EXAM: CHEST - 2 VIEW COMPARISON:  MRI of same day. FINDINGS: The heart size and mediastinal contours are within normal limits. Both lungs are clear. Lytic lesions are seen involving the third, fifth and 6 and seventh ribs consistent with metastatic disease. IMPRESSION: Multiple lytic lesions involving the right ribs concerning for metastatic disease. No acute cardiopulmonary abnormality seen. Electronically Signed   By: Marijo Conception M.D.   On: 11/18/2018 12:46   Mr Lumbar Spine W Wo Contrast  Result Date: 11/18/2018 CLINICAL DATA:  BILATERAL leg pain and weakness. Low back pain. Abnormal plain films. EXAM: MRI LUMBAR SPINE WITHOUT AND WITH CONTRAST TECHNIQUE: Multiplanar and multiecho pulse sequences of the lumbar spine were obtained without and with intravenous contrast. CONTRAST:  Gadavist 7 mL. COMPARISON:  Plain films 11/06/2018. FINDINGS: Segmentation:  Standard. Alignment:  Anatomic. Vertebrae: Widespread metastatic disease, with complete or near complete replacement of T12, L2, L3, L4, L5, and the sacrum. Partial involvement of T11 and L1. No pathologic compression fracture. Posterior element involvement is prominent at L4 and L1. Both iliac wings are involved with tumor. Conus medullaris and cauda equina: Conus extends to the L1 level. Conus and cauda equina appear normal. No  significant epidural tumor. Paraspinal and other soft tissues: Negative. Disc levels: L1-L2:  Normal. L2-L3:  Schmorl's node.  No impingement. L3-L4: Shallow protrusion. Posterior element hypertrophy. No impingement. L4-L5: Shallow protrusion. Posterior element hypertrophy. Mild stenosis. No definite impingement. L5-S1:  Annular bulge.  No impingement. IMPRESSION: Widespread metastatic disease affecting the visualized thoracic, lumbar, and sacral vertebrae. No epidural tumor.  No pathologic compression fracture. Electronically Signed   By: Staci Righter M.D.   On: 11/18/2018 10:17   US Breast Ltd Uni Left Inc Axilla  Result Date: 11/20/2018 CLINICAL DATA:  Patient recalled from screening for left breast mass. EXAM: DIGITAL DIAGNOSTIC LEFT MAMMOGRAM WITH CAD AND TOMO ULTRASOUND LEFT BREAST COMPARISON:  Previous exam(s). ACR Breast Density Category c: The breast tissue is heterogeneously dense, which may obscure small masses. FINDINGS: There is a persistent irregular mass within superior aspect of the breast middle depth, further evaluated with spot compression views. Mammographic images were processed with CAD. Targeted ultrasound is performed, showing a 2.1 x 1.9 x 1.7 cm irregular taller hypoechoic mass left breast 12 o'clock position 7 cm from the nipple. No left axillary adenopathy. IMPRESSION: Suspicious left breast mass. RECOMMENDATION: Ultrasound-guided core needle biopsy suspicious left breast mass. I have discussed the findings and recommendations with the patient. Results were also provided in writing at the conclusion of the visit. If applicable, a reminder letter will be sent to the patient regarding the next appointment. BI-RADS CATEGORY  5: Highly suggestive of malignancy. Electronically Signed   By: Lovey Newcomer M.D.   On: 11/20/2018 12:07   Mm Diag Breast Tomo Uni Left  Result Date: 11/20/2018 CLINICAL DATA:  Patient recalled from screening for left  breast mass. EXAM: DIGITAL DIAGNOSTIC LEFT  MAMMOGRAM WITH CAD AND TOMO ULTRASOUND LEFT BREAST COMPARISON:  Previous exam(s). ACR Breast Density Category c: The breast tissue is heterogeneously dense, which may obscure small masses. FINDINGS: There is a persistent irregular mass within superior aspect of the breast middle depth, further evaluated with spot compression views. Mammographic images were processed with CAD. Targeted ultrasound is performed, showing a 2.1 x 1.9 x 1.7 cm irregular taller hypoechoic mass left breast 12 o'clock position 7 cm from the nipple. No left axillary adenopathy. IMPRESSION: Suspicious left breast mass. RECOMMENDATION: Ultrasound-guided core needle biopsy suspicious left breast mass. I have discussed the findings and recommendations with the patient. Results were also provided in writing at the conclusion of the visit. If applicable, a reminder letter will be sent to the patient regarding the next appointment. BI-RADS CATEGORY  5: Highly suggestive of malignancy. Electronically Signed   By: Lovey Newcomer M.D.   On: 11/20/2018 12:07   Mm 3d Screen Breast Bilateral  Result Date: 11/18/2018 CLINICAL DATA:  Screening. EXAM: DIGITAL SCREENING BILATERAL MAMMOGRAM WITH TOMO AND CAD COMPARISON:  None ACR Breast Density Category b: There are scattered areas of fibroglandular density. FINDINGS: In the left breast, a mass warrants further evaluation. In the right breast, no findings suspicious for malignancy. Images were processed with CAD. IMPRESSION: Further evaluation is suggested for a mass in the left breast. RECOMMENDATION: Diagnostic mammogram and possibly ultrasound of the left breast. (Code:[id number]-L-70M) The patient will be contacted regarding the findings, and additional imaging will be scheduled. BI-RADS CATEGORY  0: Incomplete. Need additional imaging evaluation and/or prior mammograms for comparison. Electronically Signed   By: Lajean Manes M.D.   On: 11/18/2018 14:09   Mm Clip Placement Left  Result Date:  11/22/2018 CLINICAL DATA:  Status post ultrasound-guided core biopsy of mass in the 12 o'clock location of the LEFT breast. EXAM: DIAGNOSTIC LEFT MAMMOGRAM POST ULTRASOUND BIOPSY COMPARISON:  Previous exam(s). FINDINGS: Mammographic images were obtained following ultrasound guided biopsy of mass in the 12 o'clock location of the LEFT breast and placement of a ribbon shaped clip. The clip is in expected location home within the irregular mass in the UPPER central portion of the LEFT breast. IMPRESSION: Tissue marker clip in the expected location following biopsy. Final Assessment: Post Procedure Mammograms for Marker Placement Electronically Signed   By: Nolon Nations M.D.   On: 11/22/2018 10:11   [place] Lt Breast Bx W Loc Dev 1st Lesion Img Bx Spec US Guide  Addendum Date: 11/27/2018   ADDENDUM REPORT: 11/27/2018 12:45 ADDENDUM: PATHOLOGY ADDENDUM: Pathology biopsy of LEFT breast mass 12 o'clock location: Secretory carcinoma of the breast. Pathology concordance with imaging findings: Yes Recommendation: Consultation for treatment plan. The patient has appointment Dr. Irene Limbo, scheduled on 12/02/2018. At the request of the patient, I spoke with her by telephone on 11/27/2018 at 12:41. She reports doing well after the biopsy . When I spoke with the patient, she indicates that she may seek treatment at the [facility] in [place], where her mother and sister live. I discussed with her of the importance of being seen promptly, especially in light of the multiple bone lesions which put her at high risk for pathologic fracture. The findings and recommendations are discussed with JESSICA LIVENGOOD . Electronically Signed   By: Nolon Nations M.D.   On: 11/27/2018 12:45   Result Date: 11/27/2018 CLINICAL DATA:  The patient presents for ultrasound core biopsy of mass in the 12 o'clock location  of the LEFT breast. Patient has multiple lytic bone lesions. EXAM: ULTRASOUND GUIDED LEFT BREAST CORE NEEDLE BIOPSY  COMPARISON:  11/20/2018 FINDINGS: I met with the patient and we discussed the procedure of ultrasound-guided biopsy, including benefits and alternatives. We discussed the high likelihood of a successful procedure. We discussed the risks of the procedure, including infection, bleeding, tissue injury, clip migration, and inadequate sampling. Informed written consent was given. The usual time-out protocol was performed immediately prior to the procedure. Prior to biopsy, targeted ultrasound is again performed of the LEFT axilla, confirming lack of adenopathy. Lesion quadrant: 12 o'clock LEFT breast Using sterile technique and 1% Lidocaine as local anesthetic, under direct ultrasound visualization, a 12 gauge spring-loaded device was used to perform biopsy of mass in the 12 o'clock location of the LEFT breast using a LATERAL to MEDIAL approach. At the conclusion of the procedure a ribbon shaped tissue marker clip was deployed into the biopsy cavity. Follow up 2 view mammogram was performed and dictated separately. IMPRESSION: Ultrasound guided biopsy of LEFT breast mass. No apparent complications. Electronically Signed: By: Nolon Nations M.D. On: 11/22/2018 10:25    ASSESSMENT & PLAN:   53 y.o. female with  1. 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 WBC and PLT elevated, likely paraneoplastic. Kidney function normal. Elevated calcium in keeping with her bone mets. No M Protein. Sed Rate elevated at 74. TSH borderline suppressed and will repeat this. CRP elevated. -Discussed the 11/27/18 Left breast core biopsy which revealed Secretory carcinoma of the breast, Positive for Her2 (3+), 80% Estrogen receptor, 10% Progesterone receptor. Ki67 of 80%. -Discussed the 11/18/18 MRI Lumbar which revealed "Widespread metastatic disease affecting the visualized thoracic, lumbar, and sacral vertebrae. No epidural tumor.   No pathologic compression fracture." -Discussed the 11/20/18 [place] Left breast which revealed "A 2.1 x 1.9 x 1.7 cm irregular taller hypoechoic mass left breast 12 o'clock position 7 cm from the nipple. No left axillary adenopathy." -Given family history of father with melanoma and paternal side breast cancers and Ovarian cancer, suspect BRCA2 mutation. Recommended genetic testing. -Discussed that her bone metastases is greater than expected given the size of her breast mass without regional lymphadenopathy. Discussed that a second primary would certainly be unlikely, but cannot be excluded. Will order PET/CT for further characterization and would consider bone biopsy if indicated as well, to confirm one malignancy. -Will order walker. -Discussed that I do not recommend that the pt drive, which she has not been doing per her family's recommendations. -Will refill short acting Oxycodone until pt can be seen at [facility]. -Will also order MRI Brain for complete work up -Will order baseline ECHO -Will order blood tests today and will examine calcium levels -The pt is considering moving to [place] and seeking treatment at [facility]. I recommend moving forward as quickly as possible given the [id number] and progression thus far. If she is not able to get an appointment in the next week, she will proceed with all of the above recommended work up here. Referral Coordinator at [facility] Fax at [phone] -Will speak with the pt again in 10 days   Labs today PET/CT in 5-7 days MRI Brain in 3-4 days ECHO In 3-4 days Telephone visit in 10 days   All of the patients questions were answered with apparent satisfaction. The patient knows to call the clinic with any problems,  questions or concerns.  The total time spent in the appt was 80 minutes and more than 50% was on counseling and direct patient cares.    Sullivan Lone MD MS  AAHIVMS [facility] Hematology/Oncology Physician [facility]  (Office):       [phone] (Work cell):  [phone] (Fax):           [phone]  12/02/2018 12:41 PM  I, Baldwin Jamaica, am acting as a scribe for Dr. Sullivan Lone.   .I have reviewed the above documentation for accuracy and completeness, and I agree with the above. Brunetta Genera MD   ADDENDUM  . CBC Latest Ref Rng & Units 12/02/2018 12/03/2011 12/02/2011  WBC 4.0 - 10.5 K/uL 12.6(H) 21.9(H) 31.3(H)  Hemoglobin 12.0 - 15.0 g/dL 12.2 12.3 12.7  Hematocrit 36.0 - 46.0 % 37.9 35.9(L) 38.3  Platelets 150 - 400 K/uL 431(H) 202 242    . CMP Latest Ref Rng & Units 12/02/2018 12/03/2011 12/02/2011  Glucose 70 - 99 mg/dL 105(H) 124(H) 167(H)  BUN 6 - 20 mg/dL 22(H) 9 13  Creatinine 0.44 - 1.00 mg/dL 0.91 0.87 1.09  Sodium 135 - 145 mmol/L 138 134(L) 134(L)  Potassium 3.5 - 5.1 mmol/L 3.7 3.9 4.2  Chloride 98 - 111 mmol/L 98 103 100  CO2 22 - 32 mmol/L '25 20 23  '$ Calcium 8.9 - 10.3 mg/dL 11.8(H) 8.5 8.2(L)  Total Protein 6.5 - 8.1 g/dL 7.9 - -  Total Bilirubin 0.3 - 1.2 mg/dL 0.5 - -  Alkaline Phos 38 - 126 U/L 491(H) - -  AST 15 - 41 U/L 108(H) - -  ALT 0 - 44 U/L 72(H) - -   Ionized calcium pending  Component     Latest Ref Rng & Units 12/02/2018  CA 15-3     0.0 - 25.0 U/mL 24.8  CA 27.29     0.0 - 38.6 U/mL 43.4 (H)  Vitamin D, 25-Hydroxy     30.0 - 100.0 ng/mL 12.9 (L)    PLAN -tried to call patient x 2 to discuss lab results showing hypercalcemia that needs immediate attention and rx with IVF, Bisphosphonates, possible steroids etc.  -VIt D replacement high dose after correction of hypercalcemia -pending other workup. -concern for liver metastases given abnormal LFTs. Abd examination was benign .  Marland KitchenBrunetta Genera MD

## 2018-12-02 ENCOUNTER — Telehealth: Payer: Self-pay | Admitting: Hematology

## 2018-12-02 ENCOUNTER — Other Ambulatory Visit: Payer: Self-pay

## 2018-12-02 ENCOUNTER — Inpatient Hospital Stay: Payer: BC Managed Care – PPO | Attending: Hematology | Admitting: Hematology

## 2018-12-02 ENCOUNTER — Inpatient Hospital Stay: Payer: BC Managed Care – PPO

## 2018-12-02 VITALS — BP 132/78 | HR 105 | Temp 98.0°F | Resp 18 | Ht 65.0 in | Wt 154.2 lb

## 2018-12-02 DIAGNOSIS — C7951 Secondary malignant neoplasm of bone: Secondary | ICD-10-CM | POA: Diagnosis not present

## 2018-12-02 DIAGNOSIS — C50919 Malignant neoplasm of unspecified site of unspecified female breast: Secondary | ICD-10-CM

## 2018-12-02 DIAGNOSIS — M25552 Pain in left hip: Secondary | ICD-10-CM

## 2018-12-02 DIAGNOSIS — Z8041 Family history of malignant neoplasm of ovary: Secondary | ICD-10-CM | POA: Insufficient documentation

## 2018-12-02 DIAGNOSIS — C50812 Malignant neoplasm of overlapping sites of left female breast: Secondary | ICD-10-CM | POA: Insufficient documentation

## 2018-12-02 DIAGNOSIS — Z87442 Personal history of urinary calculi: Secondary | ICD-10-CM | POA: Diagnosis not present

## 2018-12-02 DIAGNOSIS — Z803 Family history of malignant neoplasm of breast: Secondary | ICD-10-CM | POA: Diagnosis not present

## 2018-12-02 DIAGNOSIS — Z87891 Personal history of nicotine dependence: Secondary | ICD-10-CM | POA: Insufficient documentation

## 2018-12-02 DIAGNOSIS — Z86018 Personal history of other benign neoplasm: Secondary | ICD-10-CM | POA: Diagnosis not present

## 2018-12-02 DIAGNOSIS — Z170 Estrogen receptor positive status [ER+]: Secondary | ICD-10-CM | POA: Diagnosis not present

## 2018-12-02 DIAGNOSIS — Z808 Family history of malignant neoplasm of other organs or systems: Secondary | ICD-10-CM

## 2018-12-02 DIAGNOSIS — R634 Abnormal weight loss: Secondary | ICD-10-CM

## 2018-12-02 DIAGNOSIS — R945 Abnormal results of liver function studies: Secondary | ICD-10-CM | POA: Diagnosis not present

## 2018-12-02 DIAGNOSIS — M25551 Pain in right hip: Secondary | ICD-10-CM

## 2018-12-02 DIAGNOSIS — C50912 Malignant neoplasm of unspecified site of left female breast: Secondary | ICD-10-CM

## 2018-12-02 DIAGNOSIS — M79604 Pain in right leg: Secondary | ICD-10-CM

## 2018-12-02 DIAGNOSIS — M545 Low back pain: Secondary | ICD-10-CM

## 2018-12-02 DIAGNOSIS — G893 Neoplasm related pain (acute) (chronic): Secondary | ICD-10-CM

## 2018-12-02 DIAGNOSIS — M25569 Pain in unspecified knee: Secondary | ICD-10-CM

## 2018-12-02 DIAGNOSIS — R2689 Other abnormalities of gait and mobility: Secondary | ICD-10-CM

## 2018-12-02 DIAGNOSIS — M419 Scoliosis, unspecified: Secondary | ICD-10-CM

## 2018-12-02 LAB — CBC WITH DIFFERENTIAL/PLATELET
Abs Immature Granulocytes: 0.13 10*3/uL — ABNORMAL HIGH (ref 0.00–0.07)
Basophils Absolute: 0.1 10*3/uL (ref 0.0–0.1)
Basophils Relative: 1 %
Eosinophils Absolute: 0.3 10*3/uL (ref 0.0–0.5)
Eosinophils Relative: 2 %
HCT: 37.9 % (ref 36.0–46.0)
Hemoglobin: 12.2 g/dL (ref 12.0–15.0)
Immature Granulocytes: 1 %
Lymphocytes Relative: 24 %
Lymphs Abs: 3 10*3/uL (ref 0.7–4.0)
MCH: 28 pg (ref 26.0–34.0)
MCHC: 32.2 g/dL (ref 30.0–36.0)
MCV: 86.9 fL (ref 80.0–100.0)
Monocytes Absolute: 0.9 10*3/uL (ref 0.1–1.0)
Monocytes Relative: 7 %
Neutro Abs: 8.2 10*3/uL — ABNORMAL HIGH (ref 1.7–7.7)
Neutrophils Relative %: 65 %
Platelets: 431 10*3/uL — ABNORMAL HIGH (ref 150–400)
RBC: 4.36 MIL/uL (ref 3.87–5.11)
RDW: 16 % — ABNORMAL HIGH (ref 11.5–15.5)
WBC: 12.6 10*3/uL — ABNORMAL HIGH (ref 4.0–10.5)
nRBC: 0 % (ref 0.0–0.2)

## 2018-12-02 LAB — CMP (CANCER CENTER ONLY)
ALT: 72 U/L — ABNORMAL HIGH (ref 0–44)
AST: 108 U/L — ABNORMAL HIGH (ref 15–41)
Albumin: 3.6 g/dL (ref 3.5–5.0)
Alkaline Phosphatase: 491 U/L — ABNORMAL HIGH (ref 38–126)
Anion gap: 15 (ref 5–15)
BUN: 22 mg/dL — ABNORMAL HIGH (ref 6–20)
CO2: 25 mmol/L (ref 22–32)
Calcium: 11.8 mg/dL — ABNORMAL HIGH (ref 8.9–10.3)
Chloride: 98 mmol/L (ref 98–111)
Creatinine: 0.91 mg/dL (ref 0.44–1.00)
GFR, Est AFR Am: >60 mL/min (ref >60)
GFR, Estimated: >60 mL/min (ref >60)
Glucose, Bld: 105 mg/dL — ABNORMAL HIGH (ref 70–99)
Potassium: 3.7 mmol/L (ref 3.5–5.1)
Sodium: 138 mmol/L (ref 135–145)
Total Bilirubin: 0.5 mg/dL (ref 0.3–1.2)
Total Protein: 7.9 g/dL (ref 6.5–8.1)

## 2018-12-02 MED ORDER — OXYCODONE HCL 10 MG PO TABS: 10.0000 mg | ORAL_TABLET | ORAL | 0 refills | Status: AC | PRN

## 2018-12-02 NOTE — Patient Instructions (Signed)
Thank you for choosing [facility] to provide your oncology and hematology care.   Should you have questions after your visit to the [facility] ([facility]), please contact this office at [phone] between 8:30 AM and 4:30 PM. Voicemails left after 4:00 PM may not be returned until the following business day. Calls received after 4:30 PM will be answered by an off-site Nurse Triage Line.    Prescription Refills:  Please have your pharmacy contact us directly for most prescription requests.  Contact the office directly for refills of narcotics (pain medications). Allow 48-72 hours for refills.  Appointments: Please contact the [facility] scheduling department [phone] for questions regarding [facility] appointment scheduling.  Contact the schedulers with any scheduling changes so that your appointment can be rescheduled in a timely manner.   Central Scheduling for [place] [phone] - Call to schedule procedures such as PET scans, CT scans, MRI, Ultrasound, etc.  To afford each patient quality time with our providers, please arrive 30 minutes before your scheduled appointment time.  If you arrive late for your appointment, you may be asked to reschedule.  We strive to give you quality time with our providers, and arriving late affects you and other patients whose appointments are after yours. If you are a no show for multiple scheduled visits, you may be dismissed from the clinic at the providers discretion.     Resources: [facility] Social Workers [phone] for additional information on assistance programs --Anne Cunningham/Abigail Elmore  [place] DSS  336-641-3447: Information regarding food stamps, Medicaid, and utility assistance [employer] 336-333-6589   [employer]'s shared-ride transportation service for eligible riders who have a disability that prevents them from riding the fixed route bus.   Medicare [facility] 800-333-4114 Helps people with  Medicare understand their rights and benefits, navigate the Medicare system, and secure the quality healthcare they deserve [employer] 800-227-2345 Assists patients locate various types of support and financial assistance Cancer Care: 1-800-813-HOPE (4673) Provides financial assistance, online support groups, medication/co-pay assistance.      

## 2018-12-02 NOTE — Telephone Encounter (Signed)
Scheduled appt per 7/6 los. Printed calendar and avs, and gave patient the number to central radiology.

## 2018-12-03 LAB — CANCER ANTIGEN 15-3: CA 15-3: 24.8 U/mL (ref 0.0–25.0)

## 2018-12-03 LAB — VITAMIN D 25 HYDROXY (VIT D DEFICIENCY, FRACTURES): Vit D, 25-Hydroxy: 12.9 ng/mL — ABNORMAL LOW (ref 30.0–100.0)

## 2018-12-03 LAB — CANCER ANTIGEN 27.29: CA 27.29: 43.4 U/mL — ABNORMAL HIGH (ref 0.0–38.6)

## 2018-12-03 LAB — CALCIUM, IONIZED: Calcium, Ionized, Serum: 6.1 mg/dL — ABNORMAL HIGH (ref 4.5–5.6)

## 2018-12-03 NOTE — Progress Notes (Signed)
[place] Medical Certification for  Application of Disability Parking Placard (6 month) completed for patient/signed by Dr. Irene Limbo

## 2018-12-04 ENCOUNTER — Telehealth: Payer: Self-pay | Admitting: *Deleted

## 2018-12-04 NOTE — Telephone Encounter (Signed)
"  Nevaya Nagele Topham sister Ronita Hipps [phone]) would like to speak with Dr. Irene Limbo to follow up on some things.  1. Yesterday she went to [facility] in [place]. [place] because of her calcium level.  She was given Lactated ringers, x-rayed and released at 1:30 am with instructions to contact [facility] if she does not hear from them.  I do not think anything was done for her.  Does Dr. Irene Limbo think this was enough and what needs to be done for her calcium level? Hilda Fax number is [phone].  A referral and records need to be sent to [facility].  This piece is needed to help the process of transferring care to [place].  No appointment for Clovis Riley at this time and I do not know when she will receive appointment.   3. Need to talk with Dr. 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  She doesn't know or understand.  I need to be correct when I speak with her and know what I'm up against.  She says she has a small spot in her breast."

## 2018-12-04 NOTE — Telephone Encounter (Signed)
Contacted patient to verify fax number provided earlier for [facility], [place].  Patient has already contacted them to make appointment as a new patient and has asked that notes from appointment with Dr. Irene Limbo on 7/6 be faxed to [phone]. Records faxed as requested. Fax confirmation received.

## 2018-12-07 ENCOUNTER — Other Ambulatory Visit: Payer: Self-pay

## 2018-12-09 ENCOUNTER — Telehealth: Payer: Self-pay | Admitting: *Deleted

## 2018-12-09 NOTE — Telephone Encounter (Signed)
Patient called. States she has symptoms of UTI. Since she knows she also has kidney problems, wanted to know Dr. Grier Mitts recommendation on whether she can go to Urgent Care for evaluation/treatment or should she do something else. Dr. Irene Limbo given information and question.

## 2018-12-09 NOTE — Telephone Encounter (Signed)
Records faxed to [facility] - Release [id number]

## 2018-12-10 NOTE — Telephone Encounter (Signed)
Contacted patient with Dr. Grier Mitts directions - go to urgent care for evaluation of possible UTI.

## 2018-12-11 ENCOUNTER — Telehealth: Payer: Self-pay | Admitting: Hematology

## 2018-12-11 ENCOUNTER — Other Ambulatory Visit (HOSPITAL_COMMUNITY): Payer: Self-pay | Admitting: Hematology

## 2018-12-11 ENCOUNTER — Ambulatory Visit (HOSPITAL_BASED_OUTPATIENT_CLINIC_OR_DEPARTMENT_OTHER)
Admission: RE | Admit: 2018-12-11 | Discharge: 2018-12-11 | Disposition: A | Discharge status: Home / Self Care | Payer: BC Managed Care – PPO | Source: Ambulatory Visit | Attending: Hematology | Admitting: Hematology

## 2018-12-11 ENCOUNTER — Ambulatory Visit (HOSPITAL_COMMUNITY)
Admission: RE | Admit: 2018-12-11 | Discharge: 2018-12-11 | Disposition: A | Discharge status: Home / Self Care | Payer: BC Managed Care – PPO | Source: Ambulatory Visit | Attending: Hematology | Admitting: Hematology

## 2018-12-11 ENCOUNTER — Other Ambulatory Visit: Payer: Self-pay

## 2018-12-11 ENCOUNTER — Ambulatory Visit (HOSPITAL_COMMUNITY): Payer: BC Managed Care – PPO

## 2018-12-11 DIAGNOSIS — C50919 Malignant neoplasm of unspecified site of unspecified female breast: Secondary | ICD-10-CM

## 2018-12-11 DIAGNOSIS — C7951 Secondary malignant neoplasm of bone: Secondary | ICD-10-CM | POA: Diagnosis not present

## 2018-12-11 DIAGNOSIS — C50912 Malignant neoplasm of unspecified site of left female breast: Secondary | ICD-10-CM

## 2018-12-11 MED ORDER — GADOBUTROL 1 MMOL/ML IV SOLN
7.0000 mL | Freq: Once | INTRAVENOUS | Status: AC | PRN
  Administered 2018-12-11: 7 mL via INTRAVENOUS

## 2018-12-11 NOTE — Progress Notes (Signed)
  Echocardiogram 2D Echocardiogram has been performed.  [name] G Analya Louissaint 12/11/2018, 11:10 AM 

## 2018-12-11 NOTE — Telephone Encounter (Signed)
Confirmed appt/verified info °

## 2018-12-12 ENCOUNTER — Telehealth: Payer: Self-pay | Admitting: *Deleted

## 2018-12-12 ENCOUNTER — Other Ambulatory Visit (HOSPITAL_COMMUNITY): Payer: BC Managed Care – PPO

## 2018-12-12 ENCOUNTER — Inpatient Hospital Stay (HOSPITAL_BASED_OUTPATIENT_CLINIC_OR_DEPARTMENT_OTHER): Payer: BC Managed Care – PPO | Admitting: Hematology

## 2018-12-12 DIAGNOSIS — C7951 Secondary malignant neoplasm of bone: Secondary | ICD-10-CM

## 2018-12-12 DIAGNOSIS — C50912 Malignant neoplasm of unspecified site of left female breast: Secondary | ICD-10-CM

## 2018-12-12 DIAGNOSIS — C7931 Secondary malignant neoplasm of brain: Secondary | ICD-10-CM

## 2018-12-12 DIAGNOSIS — R945 Abnormal results of liver function studies: Secondary | ICD-10-CM | POA: Diagnosis not present

## 2018-12-12 NOTE — Telephone Encounter (Signed)
At patient's request, faxed results of MRI (7/15) to [facility], attn [place]. Pt has appt with Dr. Kern Alberta on Monday 7/20. Fax confirmation received.

## 2018-12-12 NOTE — Progress Notes (Signed)
HEMATOLOGY/ONCOLOGY CONSULTATION NOTE  Date of Service: 12/12/2018  Patient Care Team: [name] as PCP - General (Physician Assistant)  CHIEF COMPLAINT:  Breast Cancer   HISTORY OF PRESENTING ILLNESS:  [name] is a wonderful 53 y.o. female who has been referred to [place] by Kaitlin Pitt, PA for evaluation and management of her Newly diagnosed Breast Cancer. She is accompanied today by her sister, [name] via [employer]. The pt reports that she is doing well overall.  The pt notes that she first began having new lower back soreness in December 2019. She notes that she was lifting a heavy box of cat litter in roughly January 2020 and felt a pulling in her right chest which later improved. She notes that her lower back pain began radiating down into her right leg through the right knee in March, with a focused component of pain in the mid right femur. She notes that her pain began regressing and would have a few days of improvement each week, which she associated with increased activity. Then around the end of April, the pt notes that her pain became much worse. She used exercises for her hips, thighs, and knees and again experienced improvement. She then saw family who encouraged her to seek medical attention in early June 2020. The pt denies any weakness in her legs prior to her recent pains and bone lesions. The pt notes that her left leg has become more weak in the last month or so. She denies headaches. The pt notes that her vision worsened last summer of 2019, nothing more recently. She last saw an ophthalmologist in 2011. The pt currently experiences lower back, bilateral hip, and right upper leg and knee pain. She feels that her right hip has raised in height.The pt notes that she has lost 40 pounds in the last 4 months. She has been using 79m Oxycodone every 4 hours, except while sleeping, thus 5 per day. She began Oxycodone on June 25. She notes that her  pain is a 4-6 out of 10, which allows her to function. Her pain increases to an 8 at the end of each 4 hours. She continues on Meloxicam as well which she notes has been very helpful for her.   The pt denies skin changes over the breast. She denies nipple inversion nor discharge. She notes that her breast mass was not noticed by her until after her mammogram, and is difficult to palpate.  The pt notes that she had never had a mammogram prior to her June 2020 mammogram. She has completed mail in screening for colon cancer, the results of which are pending. She has not yet had a colonoscopy.  The pt notes that [place]  On Past Medical History, the pt reports a history of scoliosis with left leg 0.5 inches longer translating into lower lumbar pain. The patient's notes that she attributed her recent lower lumbar pain to her history of scoliosis. The pt also has a history of kidney stones in 2013 and was treated with lithotripsy. She had a cholecystectomy in 2010. Endorses left forearm lipoma which was resected several years ago. Besides these, the pt denies any other surgeries or medical concerns. She has not been pregnant before.  On Social History, the pt reports that she smoked cigarettes, beginning at age 53 Intermittent smoking since 2004, but when smoking is up to 1ppd. She is currently not smoking.  She has quit for possibly 8 years total, thus about 1ppd for 20 years. She denies much alcohol consumption. She has worked as a sixth Public house manager. She is not married.  On Family History, the patient reports Paternal 9 with breast cancer at age 26, and her daughter (the patient's cousin) with breast cancer at age 72. The pt "thinks, not certain," that family have had BRCA gene mutation. Father died of melanoma diagnosed in early-mid 19s, metastasized at age 76. Paternal grandmother with ovarian cancer. Her father has three  sisters, all of whom have had basal cells. Paternal grandfather with basal cells.  Pt endorses hives allergic reaction to Ciprofloaxacin.  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  Most recent lab results (11/13/18) of CBC w/diff is as follows: all values are WNL except for WBC at 14.3k, RDW at 16.3, PLT at 712k, MPV at 7.2, ANC at 9.8k. 11/13/18 CMP revealed all values WNL except for CO2 at 19, Calcium at 10.9, ALP at 317, AST at 45 11/13/18 Sed Rate at 74 11/08/18 SPEP and IFE revealed all values WNL except for Alpha-2 globulin at 1.5  On review of systems, pt reports lower back pain, right leg pain, bilateral hip pain, left leg weakness, 40 pound unexpected weight loss, and denies chest wall pain, headaches, recent changes in vision, abdominal pains, and any other symptoms.    INTERVAL HISTORY: I connected with [name] on 12/12/18 at 12:00 PM EDT by telephone visit and verified that I am speaking with the correct person using two identifiers.   I discussed the limitations, risks, security and privacy concerns of performing an evaluation and management service by telemedicine and the availability of in-person appointments. I also discussed with the patient that there may be a patient responsible charge related to this service. The patient expressed understanding and agreed to proceed.   Other persons participating in the visit and their role in the encounter:   - Biomedical scientist, Medical Scribe  - Patient's Sister   Patients location: Home  Providers location: [place] is here today for follow up of her breast cancer. The patient's last visit with [place] was on 12/02/2018. The pt reports that she is doing well overall.  The pt reports that she has an  appointment with Dr. Reynolds Bowl on 12/19/2018 at [facility] She notes significant pain in her lower back and knees.   Of note since the patient's last visit, pt has had a brain MRI with and without contrast completed on 12/11/2018 with results revealing Greater than 20 subcentimeter brain lesions compatible with metastases. No edema. Multiple skull metastases.  She also had an echocardiogram on 12/11/2018 showing an ejection fraction of >65%.  Lab results today (12/12/18) of CBC w/diff and CMP is as follows: all values are WNL except for WBC at 12.6, RDW at 16.0, platelets at 431, neutro abs at 8.2, abs immature granulocytes at 0.13, glucose at 105, BUN at 22, calcium at 11.8, AST at 108, ALT at 72, and alkaline phospatase at 491.  Lab Results  Component Value Date   CA2729 43.4 (H) 12/02/2018    On review of systems, pt reports significant arthritic pain and denies any other symptoms.    MEDICAL HISTORY:  On Past Medical History,  the pt reports a history of scoliosis with left leg 0.5 inches longer translating into lower lumbar pain. The patient's notes that she attributed her recent lower lumbar pain to her history of scoliosis. The pt also has a history of kidney stones in 2013 and was treated with lithotripsy. She had a cholecystectomy in 2010. Endorses left forearm lipoma which was resected several years ago. Besides these, the pt denies any other surgeries or medical concerns. She has not been pregnant before.  SURGICAL HISTORY: Past Surgical History:  Procedure Laterality Date   CHOLECYSTECTOMY      SOCIAL HISTORY:  On Social History, the pt reports that she smoked cigarettes, beginning at age 68. Intermittent smoking since 2004, but when smoking is up to 1ppd. She is currently not smoking. She has quit for possibly 8 years total, thus about 1ppd for 20 years. She denies much alcohol consumption. She has worked as a sixth Public house manager. She is not  married.   Social History   Socioeconomic History   Marital status: Single    Spouse name: Not on file   Number of children: Not on file   Years of education: Not on file   Highest education level: Not on file  Occupational History   Not on file  Social Needs   Financial resource strain: Not on file   Food insecurity    Worry: Not on file    Inability: Not on file   Transportation needs    Medical: Not on file    Non-medical: Not on file  Tobacco Use   Smoking status: Former Smoker  Substance and Sexual Activity   Alcohol use: Yes    Comment: occasionally   Drug use: No   Sexual activity: Not on file  Lifestyle   Physical activity    Days per week: Not on file    Minutes per session: Not on file   Stress: Not on file  Relationships   Social connections    Talks on phone: Not on file    Gets together: Not on file    Attends religious service: Not on file    Active member of club or organization: Not on file    Attends meetings of clubs or organizations: Not on file    Relationship status: Not on file   Intimate partner violence    Fear of current or ex partner: Not on file    Emotionally abused: Not on file    Physically abused: Not on file    Forced sexual activity: Not on file  Other Topics Concern   Not on file  Social History Narrative   Not on file    FAMILY HISTORY: Family History  Problem Relation Age of Onset   Breast cancer Paternal Aunt    Breast cancer Other    On Family History, the patient reports Paternal 72 with breast cancer at age 20, and her daughter (the patient's cousin) with breast cancer at age 66. The pt "thinks, not certain," that family have had BRCA gene mutation. Father died of melanoma diagnosed in early-mid 72s, metastasized at age 27. Paternal grandmother with ovarian cancer. Her father has three sisters, all of whom have had basal cells. Paternal grandfather with basal cells.  ALLERGIES:  is allergic to  ciprofloxacin.  MEDICATIONS:  Current Outpatient Medications  Medication Sig Dispense Refill   acetaminophen (TYLENOL) 325 MG tablet Take 650 mg by mouth every 4 (four) hours as needed.     calcium carbonate (  TUMS - DOSED IN MG ELEMENTAL CALCIUM) 500 MG chewable tablet Chew 1 tablet by mouth 2 (two) times daily.     escitalopram (LEXAPRO) 10 MG tablet Take 10 mg by mouth daily.     hyoscyamine (LEVSIN SL) 0.125 MG SL tablet Take 0.125 mg by mouth every 6 (six) hours as needed. For cramping     meloxicam (MOBIC) 15 MG tablet Take 15 mg by mouth daily.     Omeprazole (PRILOSEC PO) Take 1 tablet by mouth daily. Patient did not specify dose     Oxycodone HCl 10 MG TABS Take 1 tablet (10 mg total) by mouth every 4 (four) hours as needed. 90 tablet 0   No current facility-administered medications for this visit.     REVIEW OF SYSTEMS:    10 Point review of Systems was done is negative except as noted above.  PHYSICAL EXAMINATION: ECOG PERFORMANCE STATUS: 1 to 2.  . There were no vitals filed for this visit. There were no vitals filed for this visit. .There is no height or weight on file to calculate BMI.  GENERAL:alert, in no acute distress and comfortable SKIN: no acute rashes, no significant lesions EYES: conjunctiva are pink and non-injected, sclera anicteric OROPHARYNX: MMM, no exudates, no oropharyngeal erythema or ulceration NECK: supple, no JVD LYMPH: no palpable lymphadenopathy in the cervical, axillary or inguinal regions LUNGS: clear to auscultation b/l with normal respiratory effort HEART: regular rate & rhythm ABDOMEN:  normoactive bowel sounds , non tender, not distended. No palpable hepatosplenomegaly. Extremity: no pedal edema PSYCH: alert & oriented x 3 with fluent speech NEURO: no focal motor/sensory deficits  LABORATORY DATA:  I have reviewed the data as listed  CBC Latest Ref Rng & Units 12/02/2018 12/03/2011 12/02/2011  WBC 4.0 - 10.5 K/uL 12.6(H) 21.9(H)  31.3(H)  Hemoglobin 12.0 - 15.0 g/dL 12.2 12.3 12.7  Hematocrit 36.0 - 46.0 % 37.9 35.9(L) 38.3  Platelets 150 - 400 K/uL 431(H) 202 242    CMP Latest Ref Rng & Units 12/02/2018 12/03/2011 12/02/2011  Glucose 70 - 99 mg/dL 105(H) 124(H) 167(H)  BUN 6 - 20 mg/dL 22(H) 9 13  Creatinine 0.44 - 1.00 mg/dL 0.91 0.87 1.09  Sodium 135 - 145 mmol/L 138 134(L) 134(L)  Potassium 3.5 - 5.1 mmol/L 3.7 3.9 4.2  Chloride 98 - 111 mmol/L 98 103 100  CO2 22 - 32 mmol/L _0 Calcium 8.9 - 10.3 mg/dL 11.8(H) 8.5 8.2(L)  Total Protein 6.5 - 8.1 g/dL 7.9 - -  Total Bilirubin 0.3 - 1.2 mg/dL 0.5 - -  Alkaline Phos 38 - 126 U/L 491(H) - -  AST 15 - 41 U/L 108(H) - -  ALT 0 - 44 U/L 72(H) - -    11/22/18 Left Breast biopsy:    11/13/18 Labs with PCP:      RADIOGRAPHIC STUDIES: I have personally reviewed the radiological images as listed and agreed with the findings in the report. Dg Chest 2 View  Result Date: 11/18/2018 CLINICAL DATA:  Lytic bone lesions. EXAM: CHEST - 2 VIEW COMPARISON:  MRI of same day. FINDINGS: The heart size and mediastinal contours are within normal limits. Both lungs are clear. Lytic lesions are seen involving the third, fifth and 6 and seventh ribs consistent with metastatic disease. IMPRESSION: Multiple lytic lesions involving the right ribs concerning for metastatic disease. No acute cardiopulmonary abnormality seen. Electronically Signed   By: Marijo Conception M.D.   On: 11/18/2018 12:46   Mr  Brain W Wo Contrast  Result Date: 12/11/2018 CLINICAL DATA:  Breast cancer staging. Widespread osseous metastases. EXAM: MRI HEAD WITHOUT AND WITH CONTRAST TECHNIQUE: Multiplanar, multiecho pulse sequences of the brain and surrounding structures were obtained without and with intravenous contrast. CONTRAST:  7 mL Gadavist COMPARISON:  None. FINDINGS: Brain: There is no evidence of acute infarct, intracranial hemorrhage, mass, midline shift, or extra-axial fluid collection. The ventricles  and sulci are normal. No significant white matter disease is evident. Enhancing lesions measure 6 mm posteriorly in the left cerebellum (series 11, image 21), 3 mm in the left parietal lobe (series 11, image 55 and series 12, image 17), 3 mm in the right superior frontal gyrus (series 11, image 72), and 3 mm in the inferior right frontal lobe/gyrus rectus (series 11, image 32 and series 12, image 11). Some of these lesions demonstrate intrinsic T1 hyperintensity. There are approximately 20 additional punctate lesions involving the cerebrum and cerebellum which are intrinsically T1 hyperintense and most conspicuous on the precontrast axial T1 sequence (annotated on series 8). There is no edema associated with any of these lesions. Vascular: Major intracranial vascular flow voids are preserved. Skull and upper cervical spine: Multiple skull lesions consistent with metastases including a large left parieto-occipital lesion with associated mild underlying dural enhancement. Sinuses/Orbits: Unremarkable orbits. Paranasal sinuses and mastoid air cells are clear. Other: None. IMPRESSION: 1. Greater than 20 subcentimeter brain lesions compatible with metastases. No edema. 2. Multiple skull metastases. Electronically Signed   By: Logan Bores M.D.   On: 12/11/2018 17:12   Mr Lumbar Spine W Wo Contrast  Result Date: 11/18/2018 CLINICAL DATA:  BILATERAL leg pain and weakness. Low back pain. Abnormal plain films. EXAM: MRI LUMBAR SPINE WITHOUT AND WITH CONTRAST TECHNIQUE: Multiplanar and multiecho pulse sequences of the lumbar spine were obtained without and with intravenous contrast. CONTRAST:  Gadavist 7 mL. COMPARISON:  Plain films 11/06/2018. FINDINGS: Segmentation:  Standard. Alignment:  Anatomic. Vertebrae: Widespread metastatic disease, with complete or near complete replacement of T12, L2, L3, L4, L5, and the sacrum. Partial involvement of T11 and L1. No pathologic compression fracture. Posterior element involvement  is prominent at L4 and L1. Both iliac wings are involved with tumor. Conus medullaris and cauda equina: Conus extends to the L1 level. Conus and cauda equina appear normal. No significant epidural tumor. Paraspinal and other soft tissues: Negative. Disc levels: L1-L2:  Normal. L2-L3:  Schmorl's node.  No impingement. L3-L4: Shallow protrusion. Posterior element hypertrophy. No impingement. L4-L5: Shallow protrusion. Posterior element hypertrophy. Mild stenosis. No definite impingement. L5-S1:  Annular bulge.  No impingement. IMPRESSION: Widespread metastatic disease affecting the visualized thoracic, lumbar, and sacral vertebrae. No epidural tumor.  No pathologic compression fracture. Electronically Signed   By: Staci Righter M.D.   On: 11/18/2018 10:17   US Breast Ltd Uni Left Inc Axilla  Result Date: 11/20/2018 CLINICAL DATA:  Patient recalled from screening for left breast mass. EXAM: DIGITAL DIAGNOSTIC LEFT MAMMOGRAM WITH CAD AND TOMO ULTRASOUND LEFT BREAST COMPARISON:  Previous exam(s). ACR Breast Density Category c: The breast tissue is heterogeneously dense, which may obscure small masses. FINDINGS: There is a persistent irregular mass within superior aspect of the breast middle depth, further evaluated with spot compression views. Mammographic images were processed with CAD. Targeted ultrasound is performed, showing a 2.1 x 1.9 x 1.7 cm irregular taller hypoechoic mass left breast 12 o'clock position 7 cm from the nipple. No left axillary adenopathy. IMPRESSION: Suspicious left breast mass. RECOMMENDATION: Ultrasound-guided core  needle biopsy suspicious left breast mass. I have discussed the findings and recommendations with the patient. Results were also provided in writing at the conclusion of the visit. If applicable, a reminder letter will be sent to the patient regarding the next appointment. BI-RADS CATEGORY  5: Highly suggestive of malignancy. Electronically Signed   By: Lovey Newcomer M.D.   On:  11/20/2018 12:07   Mm Diag Breast Tomo Uni Left  Result Date: 11/20/2018 CLINICAL DATA:  Patient recalled from screening for left breast mass. EXAM: DIGITAL DIAGNOSTIC LEFT MAMMOGRAM WITH CAD AND TOMO ULTRASOUND LEFT BREAST COMPARISON:  Previous exam(s). ACR Breast Density Category c: The breast tissue is heterogeneously dense, which may obscure small masses. FINDINGS: There is a persistent irregular mass within superior aspect of the breast middle depth, further evaluated with spot compression views. Mammographic images were processed with CAD. Targeted ultrasound is performed, showing a 2.1 x 1.9 x 1.7 cm irregular taller hypoechoic mass left breast 12 o'clock position 7 cm from the nipple. No left axillary adenopathy. IMPRESSION: Suspicious left breast mass. RECOMMENDATION: Ultrasound-guided core needle biopsy suspicious left breast mass. I have discussed the findings and recommendations with the patient. Results were also provided in writing at the conclusion of the visit. If applicable, a reminder letter will be sent to the patient regarding the next appointment. BI-RADS CATEGORY  5: Highly suggestive of malignancy. Electronically Signed   By: Lovey Newcomer M.D.   On: 11/20/2018 12:07   Mm 3d Screen Breast Bilateral  Result Date: 11/18/2018 CLINICAL DATA:  Screening. EXAM: DIGITAL SCREENING BILATERAL MAMMOGRAM WITH TOMO AND CAD COMPARISON:  None ACR Breast Density Category b: There are scattered areas of fibroglandular density. FINDINGS: In the left breast, a mass warrants further evaluation. In the right breast, no findings suspicious for malignancy. Images were processed with CAD. IMPRESSION: Further evaluation is suggested for a mass in the left breast. RECOMMENDATION: Diagnostic mammogram and possibly ultrasound of the left breast. (Code:FI-L-91M) The patient will be contacted regarding the findings, and additional imaging will be scheduled. BI-RADS CATEGORY  0: Incomplete. Need additional imaging  evaluation and/or prior mammograms for comparison. Electronically Signed   By: Lajean Manes M.D.   On: 11/18/2018 14:09   Mm Clip Placement Left  Result Date: 11/22/2018 CLINICAL DATA:  Status post ultrasound-guided core biopsy of mass in the 12 o'clock location of the LEFT breast. EXAM: DIAGNOSTIC LEFT MAMMOGRAM POST ULTRASOUND BIOPSY COMPARISON:  Previous exam(s). FINDINGS: Mammographic images were obtained following ultrasound guided biopsy of mass in the 12 o'clock location of the LEFT breast and placement of a ribbon shaped clip. The clip is in expected location home within the irregular mass in the UPPER central portion of the LEFT breast. IMPRESSION: Tissue marker clip in the expected location following biopsy. Final Assessment: Post Procedure Mammograms for Marker Placement Electronically Signed   By: Nolon Nations M.D.   On: 11/22/2018 10:11   [place] Lt Breast Bx W Loc Dev 1st Lesion Img Bx Spec US Guide  Addendum Date: 11/27/2018   ADDENDUM REPORT: 11/27/2018 12:45 ADDENDUM: PATHOLOGY ADDENDUM: Pathology biopsy of LEFT breast mass 12 o'clock location: Secretory carcinoma of the breast. Pathology concordance with imaging findings: Yes Recommendation: Consultation for treatment plan. The patient has appointment Dr. Irene Limbo, scheduled on 12/02/2018. At the request of the patient, I spoke with her by telephone on 11/27/2018 at 12:41. She reports doing well after the biopsy . When I spoke with the patient, she indicates that she may seek treatment at  the [facility] in [place], where her mother and sister live. I discussed with her of the importance of being seen promptly, especially in light of the multiple bone lesions which put her at high risk for pathologic fracture. The findings and recommendations are discussed with [name] . Electronically Signed   By: Nolon Nations M.D.   On: 11/27/2018 12:45   Result Date: 11/27/2018 CLINICAL DATA:  The patient presents for ultrasound core  biopsy of mass in the 12 o'clock location of the LEFT breast. Patient has multiple lytic bone lesions. EXAM: ULTRASOUND GUIDED LEFT BREAST CORE NEEDLE BIOPSY COMPARISON:  11/20/2018 FINDINGS: I met with the patient and we discussed the procedure of ultrasound-guided biopsy, including benefits and alternatives. We discussed the high likelihood of a successful procedure. We discussed the risks of the procedure, including infection, bleeding, tissue injury, clip migration, and inadequate sampling. Informed written consent was given. The usual time-out protocol was performed immediately prior to the procedure. Prior to biopsy, targeted ultrasound is again performed of the LEFT axilla, confirming lack of adenopathy. Lesion quadrant: 12 o'clock LEFT breast Using sterile technique and 1% Lidocaine as local anesthetic, under direct ultrasound visualization, a 12 gauge spring-loaded device was used to perform biopsy of mass in the 12 o'clock location of the LEFT breast using a LATERAL to MEDIAL approach. At the conclusion of the procedure a ribbon shaped tissue marker clip was deployed into the biopsy cavity. Follow up 2 view mammogram was performed and dictated separately. IMPRESSION: Ultrasound guided biopsy of LEFT breast mass. No apparent complications. Electronically Signed: By: Nolon Nations M.D. On: 11/22/2018 10:25    ASSESSMENT & PLAN:   53 y.o. female with  1. 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 Ki67 of 80%.  11/18/18 MRI Lumbar which revealed "Widespread metastatic disease affecting the visualized thoracic, lumbar, and sacral vertebrae. No epidural tumor.  No pathologic compression fracture."  11/20/18 [place] Left breast which revealed "A 2.1 x 1.9 x 1.7 cm irregular taller hypoechoic mass left breast 12 o'clock  position 7 cm from the nipple. No left axillary adenopathy."  2. Brainmetastases  3. Bone mets  4. Likely Liver metastases- given abnormal liver functions. PLAN -Discussed pt labwork today, 12/12/18; all values are WNL except for WBC at 12.6, RDW at 16.0, platelets at 431, neutro abs at 8.2, abs immature granulocytes at 0.13, glucose at 105, BUN at 22, calcium at 11.8, AST at 108, ALT at 72, and alkaline phospatase at 491. -Discussed 12/11/2018 brain MRI with results revealing Greater than 20 subcentimeter brain lesions compatible with metastases. No edema. Multiple skull metastases. -Discussed need for PET scan, but declined by insurance- will f/u as per her upcoming oncology transfer of care at [facility] -Discussed tumor markers  -Discussed need for genetic counseling for potential BRCA1/2 mutation -Discussed need for Whole Body CT and Bone Scan if PET not approved -Discussed potential follow up plans  -concern for liver and bone metastases.    All of the patients questions were answered with apparent satisfaction. The patient knows to call the clinic with any problems, questions or concerns.  The total time spent in the appt was 25 minutes and more than 50% was on counseling and direct patient cares.     Sullivan Lone MD [place] AAHIVMS [facility] Hematology/Oncology Physician [place]  (Office):  [phone] (Work cell):  [phone] (Fax):           [phone]  12/12/2018 7:32 AM  I, Jacqualyn Posey, am acting as a Education administrator for Dr. Sullivan Lone.   .I have reviewed the above documentation for accuracy and completeness, and I agree with the above. Brunetta Genera MD

## 2018-12-13 ENCOUNTER — Telehealth: Payer: Self-pay | Admitting: Hematology

## 2018-12-13 ENCOUNTER — Other Ambulatory Visit: Payer: Self-pay | Admitting: *Deleted

## 2018-12-13 ENCOUNTER — Telehealth: Payer: Self-pay | Admitting: *Deleted

## 2018-12-13 NOTE — Telephone Encounter (Signed)
No los per 7/16. °

## 2018-12-13 NOTE — Telephone Encounter (Signed)
At patient's request, faxed following results to [facility], attn Tameka [phone]: MRI - Lumbar Spine 11/18/2018 MM Diag L Breast - 11/20/2018 U/S L Breast - 11/20/2018 Echocardiogram - 12/11/2018 Fax confirmation received. Patient had requested a CD of images be sent to [facility] Contacted [facility]. They will make a CD to send. They can also push results to [facility] following contact/request from provider at [place]. Provider should call [phone].  Notified patient of this information. She states she will contact Dr. Elinor Dodge office at [place] (has appt 12/16/2018) and ask them to contact [facility] at number given

## 2018-12-16 ENCOUNTER — Telehealth: Payer: Self-pay | Admitting: *Deleted

## 2018-12-16 NOTE — Telephone Encounter (Signed)
Faxed records to [facility], RN - Release [phone]

## 2020-07-09 IMAGING — MG DIGITAL SCREENING BILATERAL MAMMOGRAM WITH TOMO AND CAD
8 series · 8 of 24 positions shown · non-contrast
Comparison: None

CLINICAL DATA: Screening.

EXAM:
DIGITAL SCREENING BILATERAL MAMMOGRAM WITH TOMO AND CAD

[L CC synth-2D]
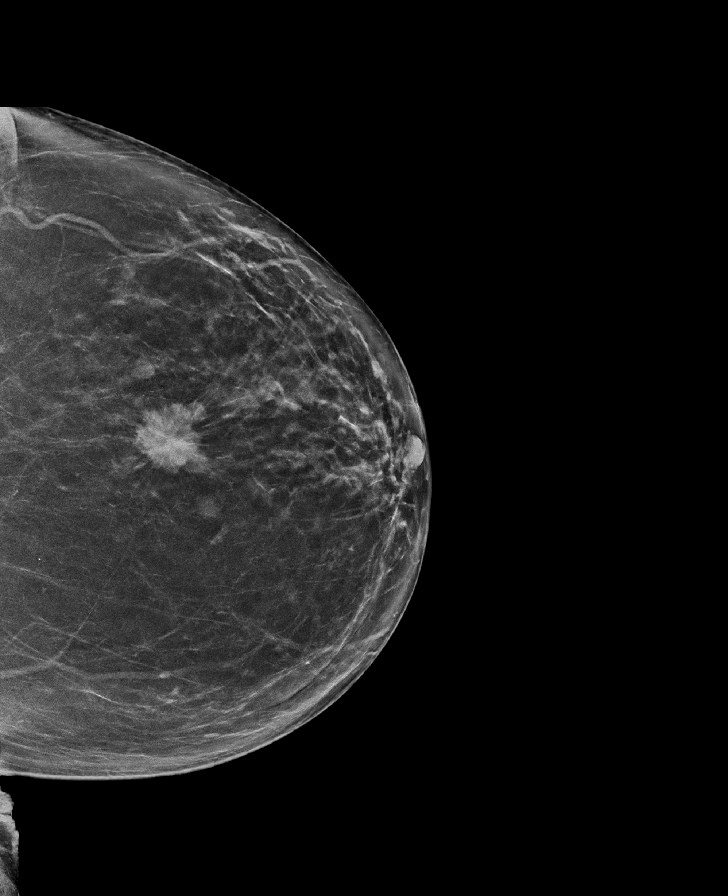

[R CC synth-2D]
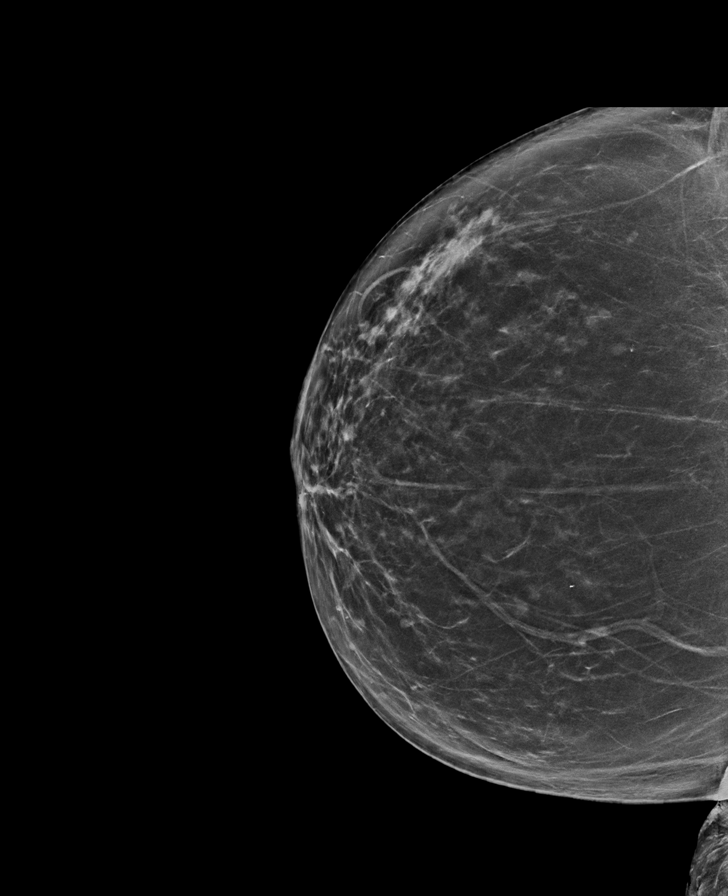

[L MLO synth-2D]
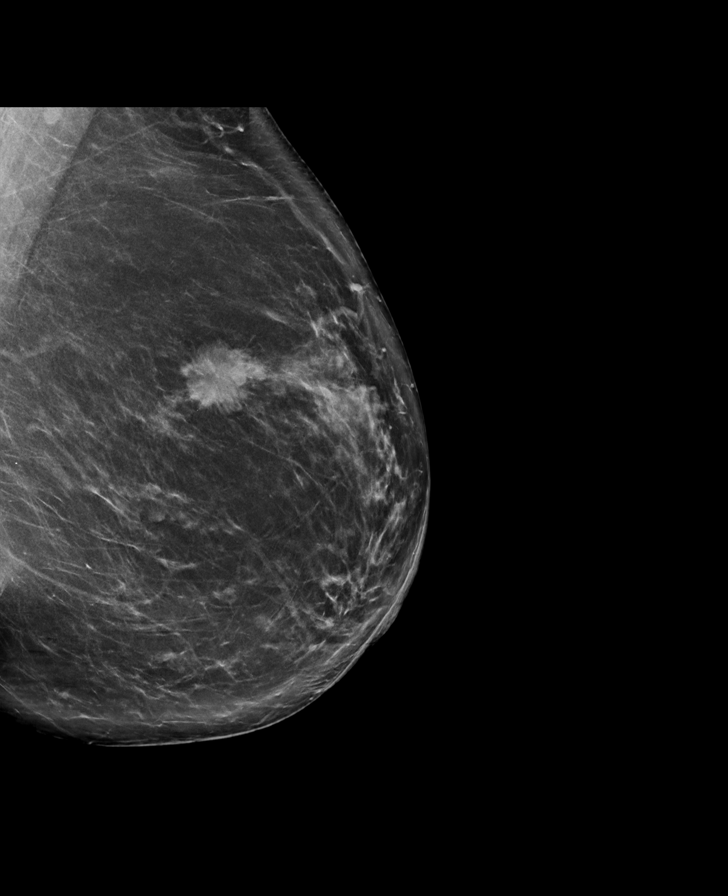

[R MLO synth-2D]
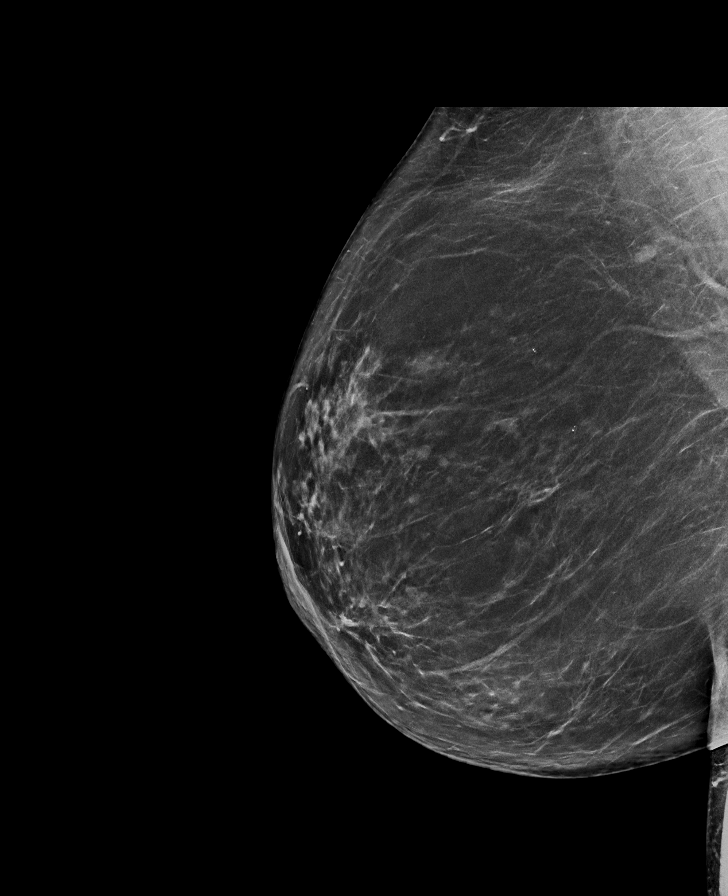

[R CC tomo · tomo slice 39/76.0]
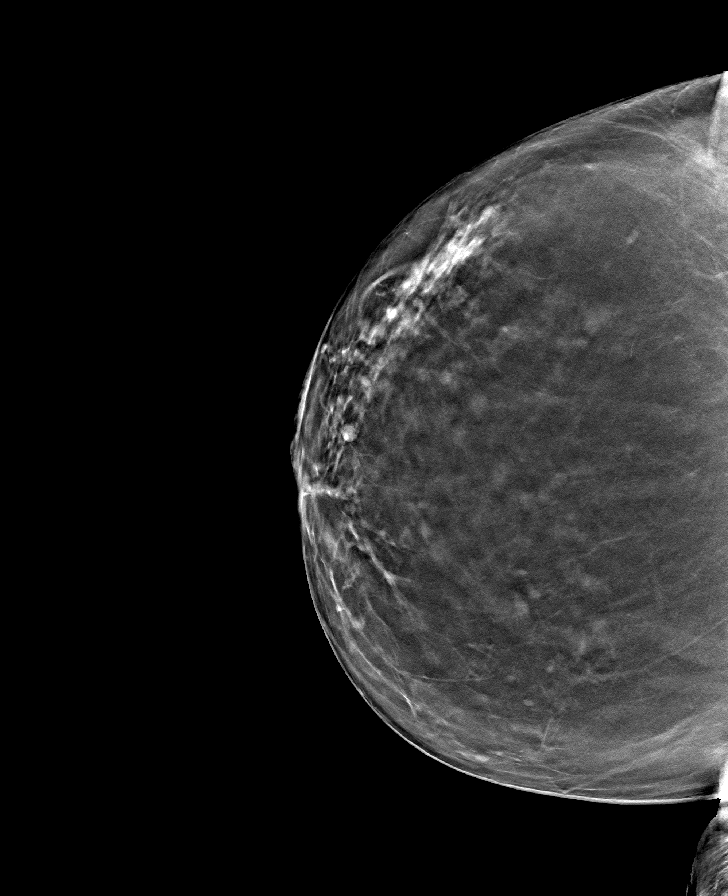

[L MLO tomo · tomo slice 45/88.0]
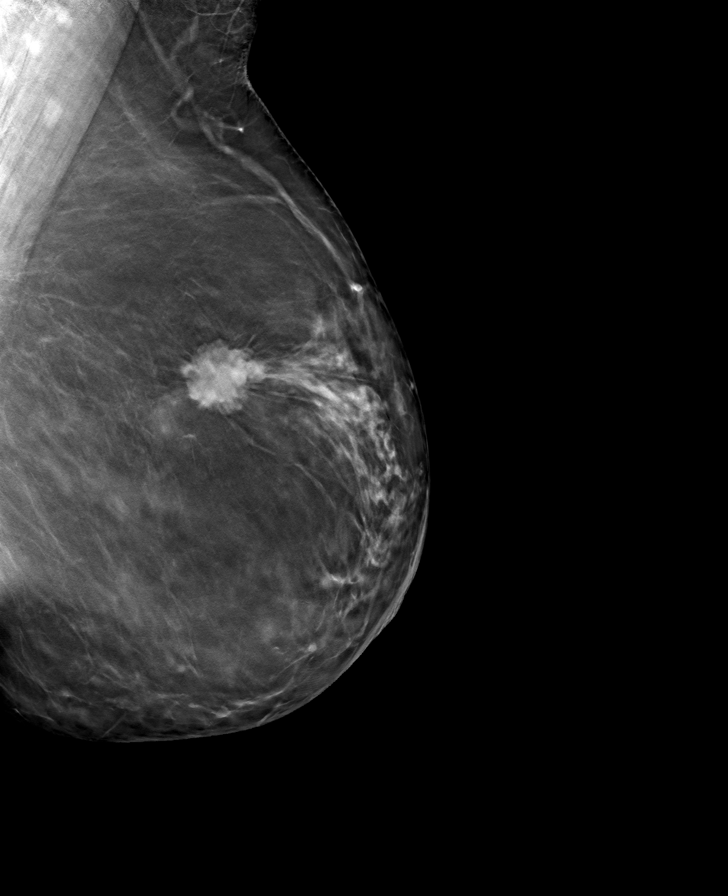

[L CC tomo · tomo slice 41/80.0]
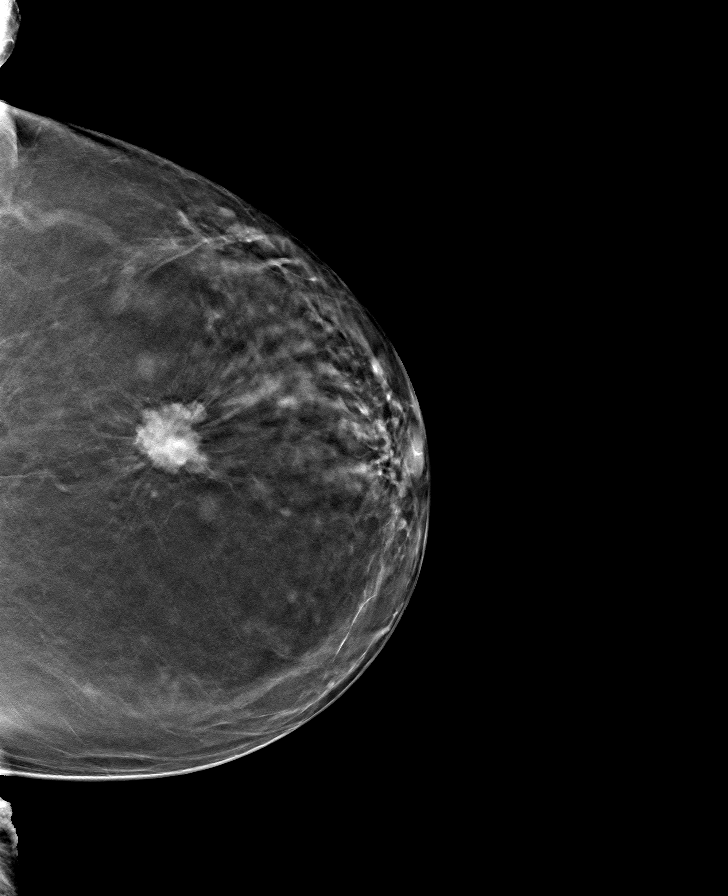

[R MLO tomo · tomo slice 43/85.0]
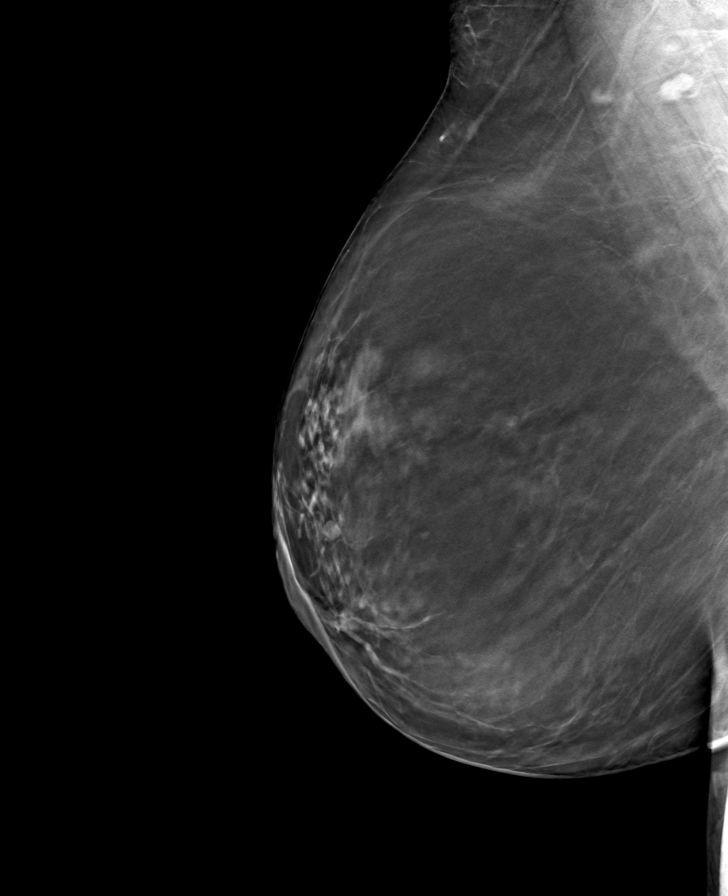

[8 of 24 positions shown; findings below may reference images not displayed]

ACR Breast Density Category b: There are scattered areas of
fibroglandular density.
FINDINGS: In the left breast, a mass warrants further evaluation. In the right
breast, no findings suspicious for malignancy.

Images were processed with CAD.
IMPRESSION: Further evaluation is suggested for a mass in the left breast.

RECOMMENDATION:
Diagnostic mammogram and possibly ultrasound of the left breast.
(Code:[id number])

The patient will be contacted regarding the findings, and additional
imaging will be scheduled.

BI-RADS CATEGORY  0: Incomplete. Need additional imaging evaluation
and/or prior mammograms for comparison.

## 2020-07-11 IMAGING — DX CHEST - 2 VIEW
2 series · 2 of 2 positions shown · non-contrast
Comparison: MRI of same day.

CLINICAL DATA: Lytic bone lesions.

EXAM:
CHEST - 2 VIEW

[chest pa]
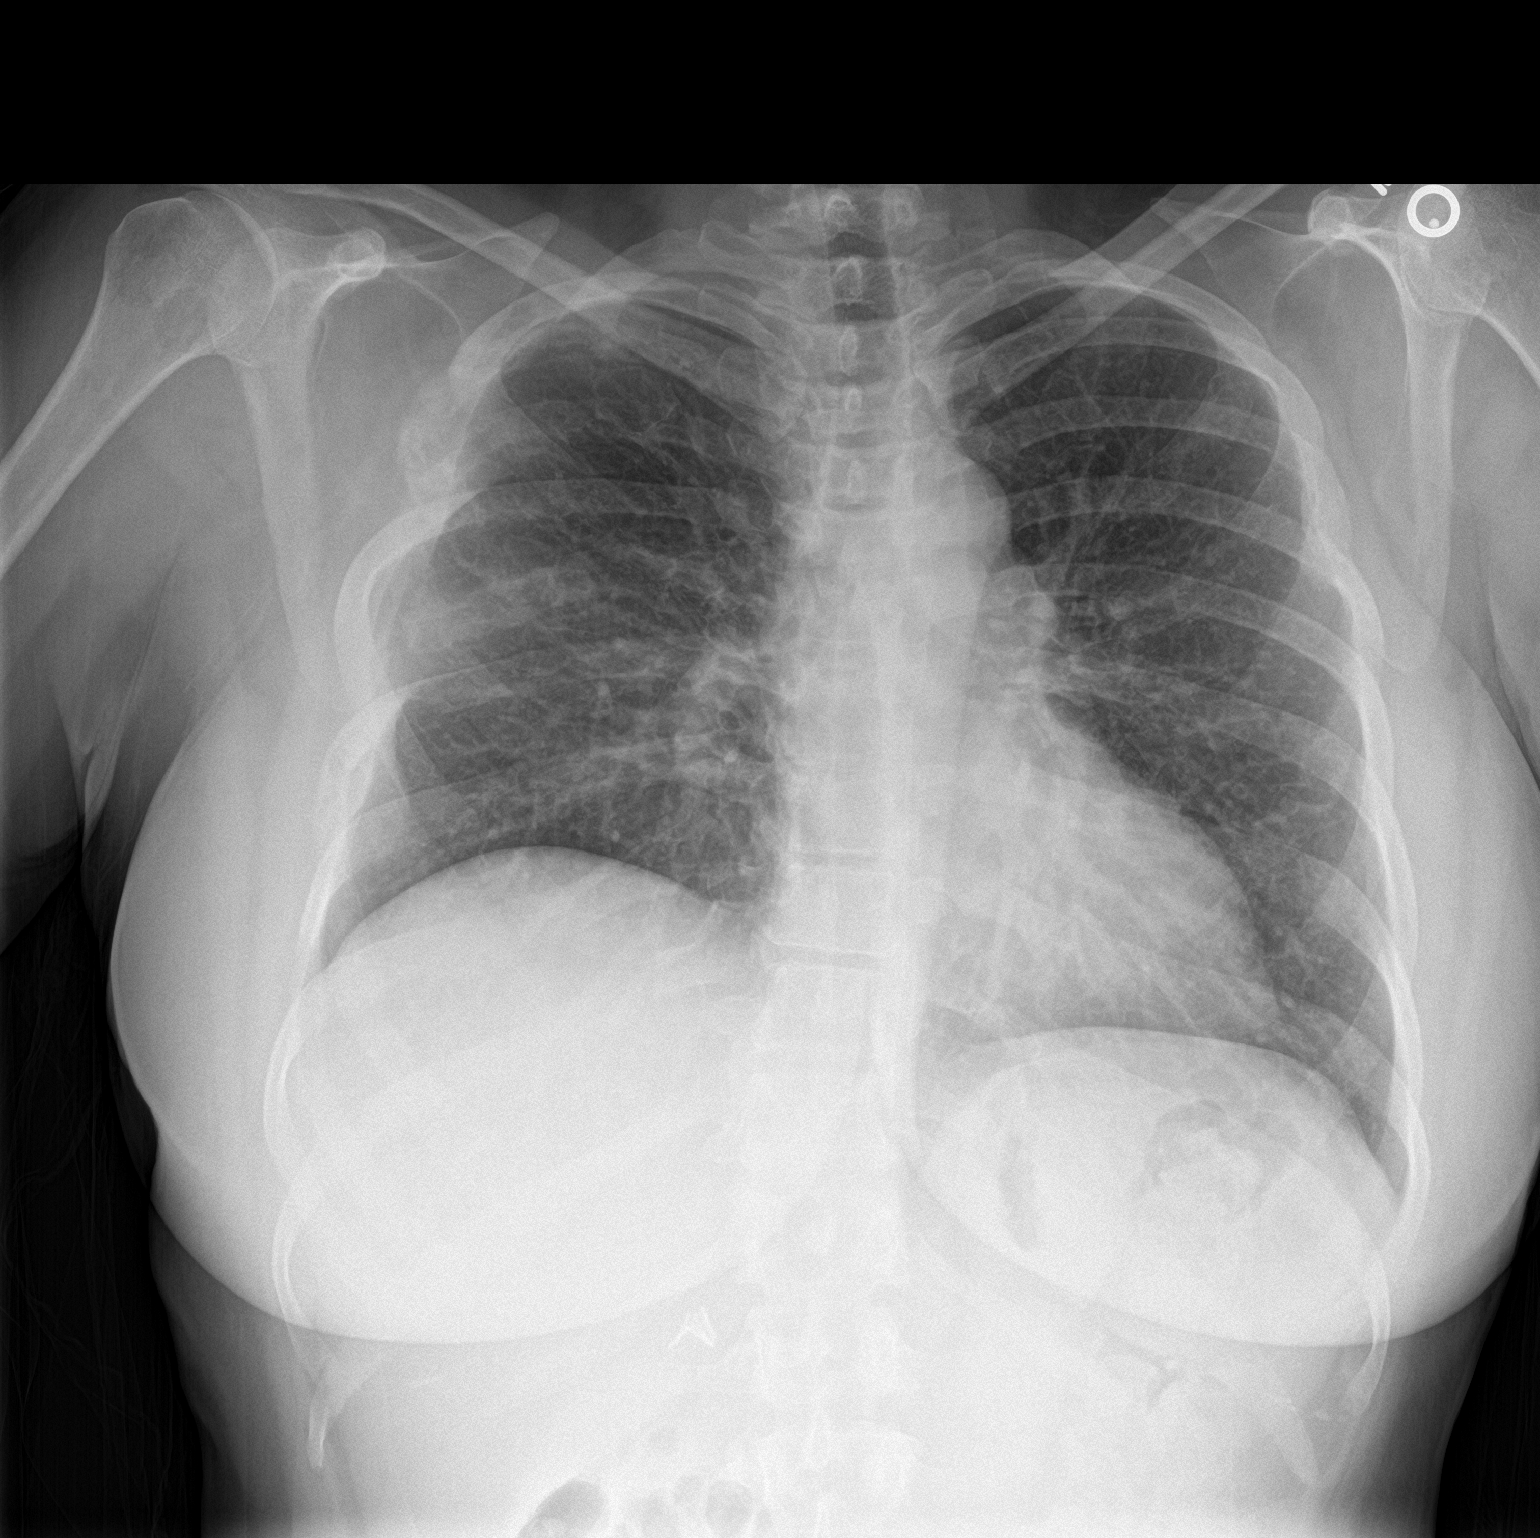

[chest lat]
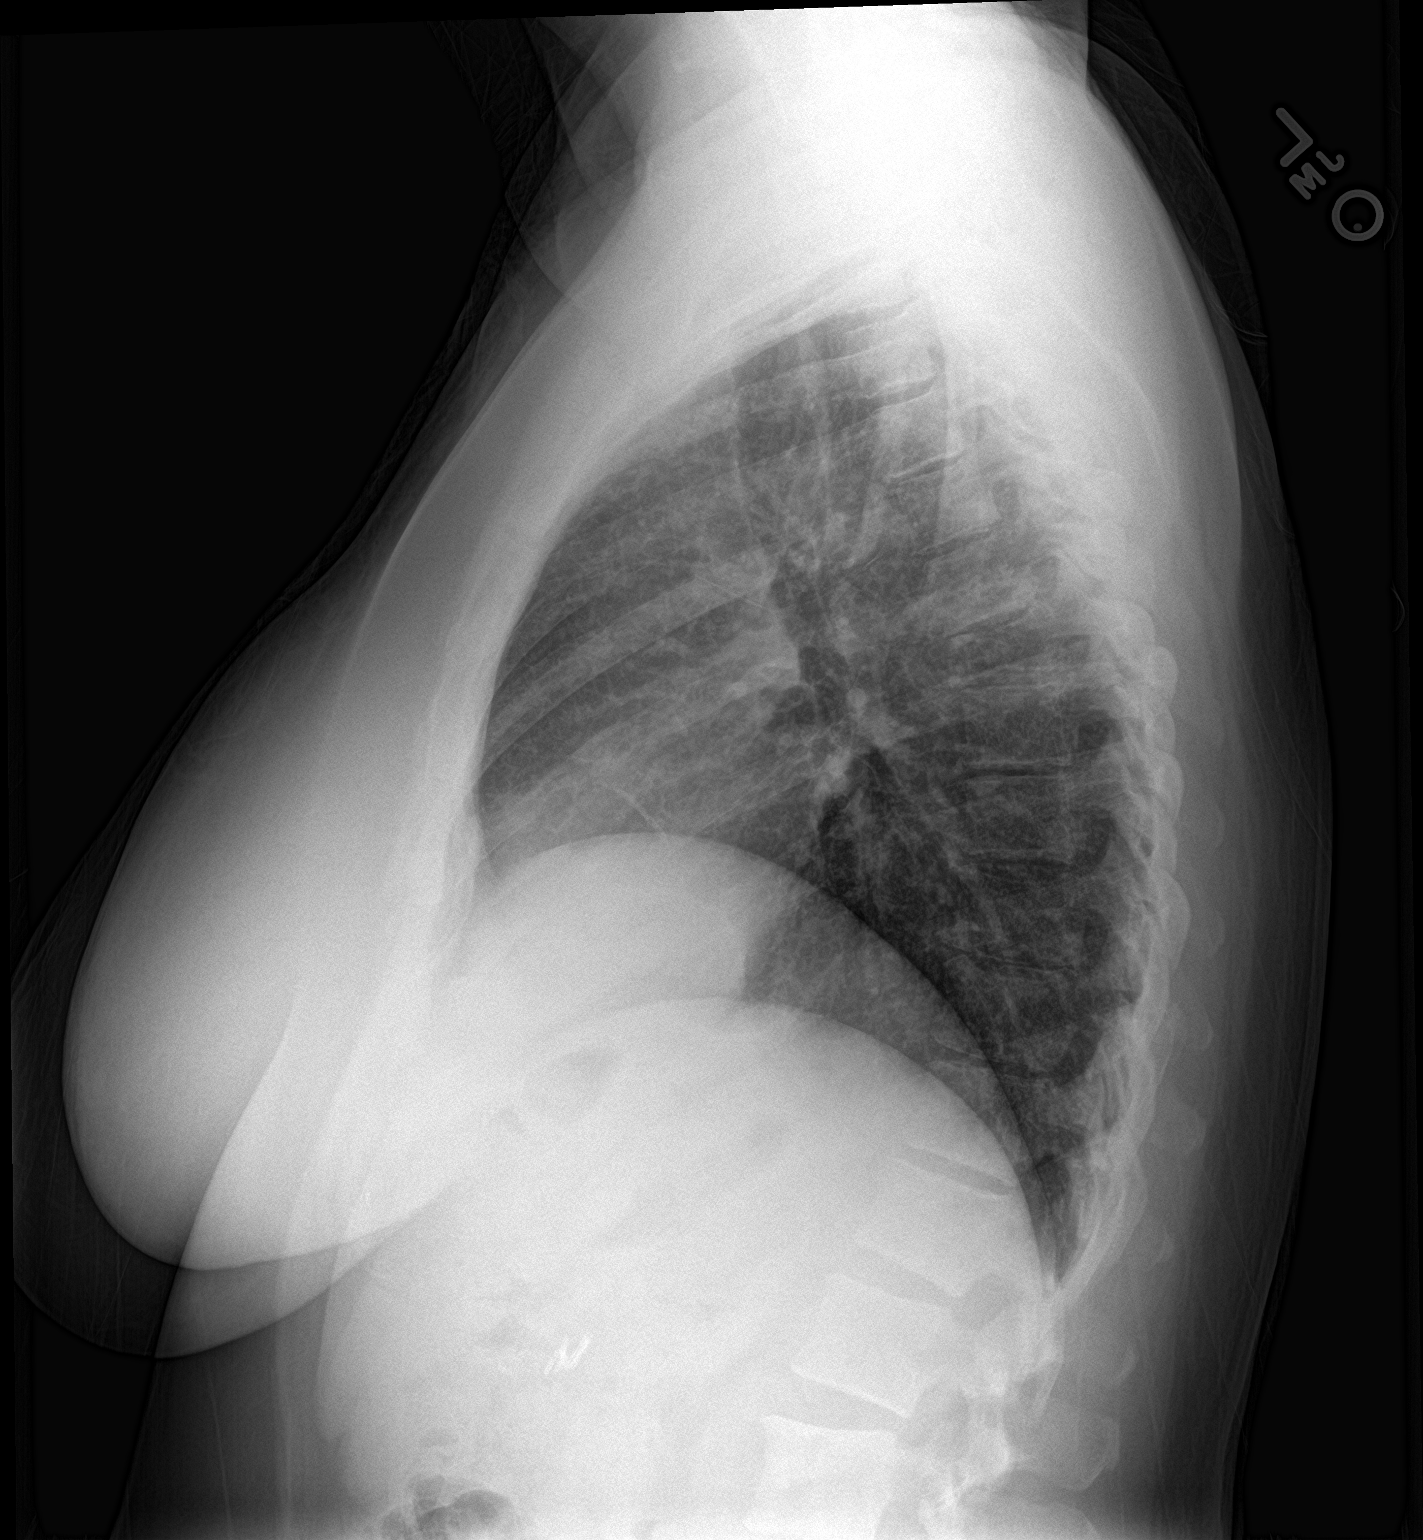

[2 of 2 positions shown; findings below may reference images not displayed]

FINDINGS: The heart size and mediastinal contours are within normal limits.
Both lungs are clear. Lytic lesions are seen involving the third,
fifth and 6 and seventh ribs consistent with metastatic disease.
IMPRESSION: Multiple lytic lesions involving the right ribs concerning for
metastatic disease. No acute cardiopulmonary abnormality seen.
